# Patient Record
Sex: Male | Born: 1970 | Race: Black or African American | Hispanic: No | Marital: Single | Smoking: Current every day smoker
Health system: Southern US, Community
[De-identification: ages and names within clinical notes are randomized; demographics above are authoritative.]

## PROBLEM LIST (undated history)

## (undated) HISTORY — PX: ORTHOPEDIC SURGERY: SHX850

---

## 1999-04-14 ENCOUNTER — Emergency Department (HOSPITAL_COMMUNITY): Admission: EM | Admit: 1999-04-14 | Discharge: 1999-04-14 | Payer: Self-pay | Admitting: Emergency Medicine

## 1999-04-14 ENCOUNTER — Encounter: Payer: Self-pay | Admitting: Emergency Medicine

## 1999-04-16 ENCOUNTER — Emergency Department (HOSPITAL_COMMUNITY): Admission: EM | Admit: 1999-04-16 | Discharge: 1999-04-16 | Payer: Self-pay | Admitting: *Deleted

## 2002-07-02 ENCOUNTER — Encounter: Payer: Self-pay | Admitting: Emergency Medicine

## 2002-07-02 ENCOUNTER — Emergency Department (HOSPITAL_COMMUNITY): Admission: EM | Admit: 2002-07-02 | Discharge: 2002-07-02 | Payer: Self-pay | Admitting: Emergency Medicine

## 2002-11-23 ENCOUNTER — Emergency Department (HOSPITAL_COMMUNITY): Admission: EM | Admit: 2002-11-23 | Discharge: 2002-11-23 | Payer: Self-pay

## 2003-11-14 ENCOUNTER — Emergency Department (HOSPITAL_COMMUNITY): Admission: EM | Admit: 2003-11-14 | Discharge: 2003-11-14 | Payer: Self-pay | Admitting: Emergency Medicine

## 2004-01-27 ENCOUNTER — Emergency Department (HOSPITAL_COMMUNITY): Admission: EM | Admit: 2004-01-27 | Discharge: 2004-01-27 | Payer: Self-pay | Admitting: Emergency Medicine

## 2005-03-28 ENCOUNTER — Emergency Department (HOSPITAL_COMMUNITY): Admission: EM | Admit: 2005-03-28 | Discharge: 2005-03-28 | Payer: Self-pay | Admitting: Emergency Medicine

## 2005-03-30 ENCOUNTER — Emergency Department (HOSPITAL_COMMUNITY): Admission: EM | Admit: 2005-03-30 | Discharge: 2005-03-30 | Payer: Self-pay | Admitting: Emergency Medicine

## 2006-02-16 ENCOUNTER — Emergency Department (HOSPITAL_COMMUNITY): Admission: EM | Admit: 2006-02-16 | Discharge: 2006-02-16 | Payer: Self-pay | Admitting: Emergency Medicine

## 2006-04-11 ENCOUNTER — Emergency Department (HOSPITAL_COMMUNITY): Admission: EM | Admit: 2006-04-11 | Discharge: 2006-04-12 | Payer: Self-pay | Admitting: Emergency Medicine

## 2006-07-04 ENCOUNTER — Emergency Department (HOSPITAL_COMMUNITY): Admission: EM | Admit: 2006-07-04 | Discharge: 2006-07-04 | Payer: Self-pay | Admitting: Emergency Medicine

## 2007-06-28 ENCOUNTER — Emergency Department (HOSPITAL_COMMUNITY): Admission: EM | Admit: 2007-06-28 | Discharge: 2007-06-29 | Payer: Self-pay | Admitting: Emergency Medicine

## 2008-03-29 ENCOUNTER — Emergency Department (HOSPITAL_COMMUNITY): Admission: EM | Admit: 2008-03-29 | Discharge: 2008-03-30 | Payer: Self-pay | Admitting: Emergency Medicine

## 2008-05-17 ENCOUNTER — Emergency Department (HOSPITAL_COMMUNITY): Admission: EM | Admit: 2008-05-17 | Discharge: 2008-05-17 | Payer: Self-pay | Admitting: Emergency Medicine

## 2008-06-13 ENCOUNTER — Observation Stay (HOSPITAL_COMMUNITY): Admission: EM | Admit: 2008-06-13 | Discharge: 2008-06-14 | Payer: Self-pay | Admitting: Emergency Medicine

## 2008-07-08 ENCOUNTER — Emergency Department (HOSPITAL_COMMUNITY): Admission: EM | Admit: 2008-07-08 | Discharge: 2008-07-08 | Payer: Self-pay | Admitting: Emergency Medicine

## 2008-08-07 ENCOUNTER — Emergency Department (HOSPITAL_COMMUNITY): Admission: EM | Admit: 2008-08-07 | Discharge: 2008-08-08 | Payer: Self-pay | Admitting: Emergency Medicine

## 2009-05-28 ENCOUNTER — Emergency Department (HOSPITAL_COMMUNITY): Admission: EM | Admit: 2009-05-28 | Discharge: 2009-05-28 | Payer: Self-pay | Admitting: Emergency Medicine

## 2009-06-16 ENCOUNTER — Emergency Department (HOSPITAL_COMMUNITY): Admission: EM | Admit: 2009-06-16 | Discharge: 2009-06-17 | Payer: Self-pay | Admitting: Emergency Medicine

## 2009-10-04 ENCOUNTER — Emergency Department (HOSPITAL_COMMUNITY): Admission: EM | Admit: 2009-10-04 | Discharge: 2009-10-04 | Payer: Self-pay | Admitting: Emergency Medicine

## 2010-02-27 ENCOUNTER — Emergency Department (HOSPITAL_COMMUNITY): Admission: EM | Admit: 2010-02-27 | Discharge: 2010-02-27 | Payer: Self-pay | Admitting: Emergency Medicine

## 2010-08-04 LAB — CBC
MCHC: 34.5 g/dL (ref 30.0–36.0)
MCV: 100.5 fL — ABNORMAL HIGH (ref 78.0–100.0)
Platelets: 220 10*3/uL (ref 150–400)
RBC: 4.13 MIL/uL — ABNORMAL LOW (ref 4.22–5.81)
RDW: 13.4 % (ref 11.5–15.5)
WBC: 7.4 10*3/uL (ref 4.0–10.5)

## 2010-08-04 LAB — DIFFERENTIAL
Lymphocytes Relative: 38 % (ref 12–46)
Lymphs Abs: 2.8 10*3/uL (ref 0.7–4.0)
Neutro Abs: 3.9 10*3/uL (ref 1.7–7.7)

## 2010-08-04 LAB — RAPID URINE DRUG SCREEN, HOSP PERFORMED
Amphetamines: NOT DETECTED
Benzodiazepines: NOT DETECTED
Opiates: POSITIVE — AB
Tetrahydrocannabinol: NOT DETECTED

## 2010-08-04 LAB — POCT I-STAT, CHEM 8
BUN: 10 mg/dL (ref 6–23)
Potassium: 4 mEq/L (ref 3.5–5.1)
Sodium: 139 mEq/L (ref 135–145)
TCO2: 30 mmol/L (ref 0–100)

## 2010-09-02 LAB — DIFFERENTIAL
Eosinophils Relative: 0 % (ref 0–5)
Monocytes Absolute: 0.7 10*3/uL (ref 0.1–1.0)
Monocytes Relative: 6 % (ref 3–12)
Neutrophils Relative %: 77 % (ref 43–77)

## 2010-09-02 LAB — CBC
HCT: 38.9 % — ABNORMAL LOW (ref 39.0–52.0)
Hemoglobin: 13.1 g/dL (ref 13.0–17.0)
MCHC: 33.7 g/dL (ref 30.0–36.0)
MCV: 100.9 fL — ABNORMAL HIGH (ref 78.0–100.0)
RBC: 3.86 MIL/uL — ABNORMAL LOW (ref 4.22–5.81)
WBC: 12.3 10*3/uL — ABNORMAL HIGH (ref 4.0–10.5)

## 2010-09-02 LAB — POCT I-STAT, CHEM 8
BUN: 7 mg/dL (ref 6–23)
Calcium, Ion: 1.1 mmol/L — ABNORMAL LOW (ref 1.12–1.32)
Creatinine, Ser: 1.1 mg/dL (ref 0.4–1.5)
Glucose, Bld: 112 mg/dL — ABNORMAL HIGH (ref 70–99)
HCT: 42 % (ref 39.0–52.0)
Hemoglobin: 14.3 g/dL (ref 13.0–17.0)
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
## 2011-10-07 ENCOUNTER — Encounter (HOSPITAL_COMMUNITY): Payer: Self-pay | Admitting: Physical Medicine and Rehabilitation

## 2011-10-07 ENCOUNTER — Emergency Department (HOSPITAL_COMMUNITY)
Admission: EM | Admit: 2011-10-07 | Discharge: 2011-10-07 | Disposition: A | Discharge status: Home / Self Care | Payer: Self-pay | Attending: Emergency Medicine | Admitting: Emergency Medicine

## 2011-10-07 DIAGNOSIS — F17200 Nicotine dependence, unspecified, uncomplicated: Secondary | ICD-10-CM | POA: Insufficient documentation

## 2011-10-07 DIAGNOSIS — K047 Periapical abscess without sinus: Secondary | ICD-10-CM | POA: Insufficient documentation

## 2011-10-07 DIAGNOSIS — K089 Disorder of teeth and supporting structures, unspecified: Secondary | ICD-10-CM | POA: Insufficient documentation

## 2011-10-07 MED ORDER — PENICILLIN V POTASSIUM 500 MG PO TABS: 500.0000 mg | ORAL_TABLET | Freq: Four times a day (QID) | ORAL | Status: AC

## 2011-10-07 MED ORDER — HYDROCODONE-ACETAMINOPHEN 5-325 MG PO TABS
1.0000 | ORAL_TABLET | Freq: Once | ORAL | Status: AC
  Administered 2011-10-07: 1 via ORAL
  Filled 2011-10-07: qty 1

## 2011-10-07 MED ORDER — HYDROCODONE-ACETAMINOPHEN 5-325 MG PO TABS: 1.0000 | ORAL_TABLET | ORAL | Status: AC | PRN

## 2011-10-07 NOTE — ED Notes (Signed)
Pt presents to department for evaluation of L upper molar toothache. States pain became worse today. 10/10 upon arrival. States "I think I have an abscess tooth." also states headache. No facial swelling noted. Respirations unlabored. No signs of distress at the time.

## 2011-10-07 NOTE — Discharge Instructions (Signed)
Dental Abscess A dental abscess usually starts from an infected tooth. Antibiotic medicine and pain pills can be helpful, but dental infections require the attention of a dentist. Rinse around the infected area often with salt water (a pinch of salt in 8 oz of warm water). Do not apply heat to the outside of your face. See your dentist or oral surgeon as soon as possible.  SEEK IMMEDIATE MEDICAL CARE IF:  You have increasing, severe pain that is not relieved by medicine.   You or your child has an oral temperature above 102 F (38.9 C), not controlled by medicine.   Your baby is older than 3 months with a rectal temperature of 102 F (38.9 C) or higher.   Your baby is 3 months old or younger with a rectal temperature of 100.4 F (38 C) or higher.   You develop chills, severe headache, difficulty breathing, or trouble swallowing.   You have swelling in the neck or around the eye.  Document Released: 05/05/2005 Document Revised: 04/24/2011 Document Reviewed: 10/14/2006 [employer] Patient Information 2012 [employer]. 

## 2011-10-07 NOTE — ED Provider Notes (Signed)
History     CSN: [id number]  Arrival date & time 10/07/11  1703   First MD Initiated Contact with Patient 10/07/11 1836      Chief Complaint  Patient presents with  . Dental Pain    (Consider location/radiation/quality/duration/timing/severity/associated sxs/prior treatment) Patient is a 41 y.o. male presenting with tooth pain. The history is provided by the patient. No language interpreter was used.  Dental PainThe primary symptoms include mouth pain and oral lesions. The symptoms began yesterday. The symptoms are worsening. The symptoms are new.  Additional symptoms include: gum swelling and gum tenderness.  Pain, tenderness, and swelling noted above left upper incisor.  No past medical history on file.  No past surgical history on file.  History reviewed. No pertinent family history.  History  Substance Use Topics  . Smoking status: Current Everyday Smoker -- 0.5 packs/day    Types: Cigarettes  . Smokeless tobacco: Not on file  . Alcohol Use: No      Review of Systems  HENT: Positive for dental problem.   All other systems reviewed and are negative.    Allergies  Review of patient's allergies indicates no known allergies.  Home Medications   Current Outpatient Rx  Name Route Sig Dispense Refill  . ACETAMINOPHEN 500 MG PO TABS Oral Take 1,500 mg by mouth every 4 (four) hours as needed. For pain.    Marland Kitchen LORATADINE 10 MG PO TABS Oral Take 10 mg by mouth daily.      BP 126/82  Pulse 99  Temp(Src) 98.2 F (36.8 C) (Oral)  Resp 16  SpO2 97%  Physical Exam  Nursing note and vitals reviewed. Constitutional: He is oriented to person, place, and time. He appears well-developed and well-nourished.  HENT:  Head: Normocephalic.  Right Ear: External ear normal.  Left Ear: External ear normal.  Mouth/Throat: Dental abscesses present.    Eyes: Conjunctivae are normal. Pupils are equal, round, and reactive to light.  Neck: Normal range of motion. Neck supple.    Cardiovascular: Normal rate, regular rhythm, normal heart sounds and intact distal pulses.   Pulmonary/Chest: Effort normal and breath sounds normal.  Abdominal: Soft.  Musculoskeletal: Normal range of motion.  Lymphadenopathy:    He has no cervical adenopathy.  Neurological: He is alert and oriented to person, place, and time.  Skin: Skin is warm and dry.  Psychiatric: He has a normal mood and affect. His behavior is normal. Judgment and thought content normal.    ED Course  Procedures (including critical care time)  Labs Reviewed - No data to display No results found.   No diagnosis found.   Apical abscess MDM          Jimmye Norman, NP 10/07/11 [phone]

## 2011-10-11 NOTE — ED Provider Notes (Signed)
Medical screening examination/treatment/procedure(s) were performed by non-physician practitioner and as supervising physician I was immediately available for consultation/collaboration.   Shelda Jakes, MD 10/11/11 [phone]

## 2012-03-24 ENCOUNTER — Emergency Department (HOSPITAL_COMMUNITY): Payer: Self-pay

## 2012-03-24 ENCOUNTER — Encounter (HOSPITAL_COMMUNITY): Payer: Self-pay | Admitting: Family Medicine

## 2012-03-24 ENCOUNTER — Emergency Department (HOSPITAL_COMMUNITY)
Admission: EM | Admit: 2012-03-24 | Discharge: 2012-03-24 | Disposition: A | Discharge status: Home / Self Care | Payer: Self-pay | Attending: Emergency Medicine | Admitting: Emergency Medicine

## 2012-03-24 DIAGNOSIS — L0291 Cutaneous abscess, unspecified: Secondary | ICD-10-CM

## 2012-03-24 DIAGNOSIS — F17200 Nicotine dependence, unspecified, uncomplicated: Secondary | ICD-10-CM | POA: Insufficient documentation

## 2012-03-24 DIAGNOSIS — N498 Inflammatory disorders of other specified male genital organs: Secondary | ICD-10-CM | POA: Insufficient documentation

## 2012-03-24 LAB — CBC WITH DIFFERENTIAL/PLATELET
Basophils Absolute: 0 10*3/uL (ref 0.0–0.1)
Eosinophils Absolute: 0.1 10*3/uL (ref 0.0–0.7)
Eosinophils Relative: 1 % (ref 0–5)
Hemoglobin: 14.7 g/dL (ref 13.0–17.0)
Lymphs Abs: 2.3 10*3/uL (ref 0.7–4.0)
MCHC: 34.9 g/dL (ref 30.0–36.0)
MCV: 93.1 fL (ref 78.0–100.0)
Monocytes Relative: 7 % (ref 3–12)
Neutro Abs: 7 10*3/uL (ref 1.7–7.7)
Neutrophils Relative %: 70 % (ref 43–77)
RBC: 4.52 MIL/uL (ref 4.22–5.81)

## 2012-03-24 LAB — URINALYSIS, ROUTINE W REFLEX MICROSCOPIC
Glucose, UA: NEGATIVE mg/dL
Hgb urine dipstick: NEGATIVE
Leukocytes, UA: NEGATIVE
Nitrite: NEGATIVE
Protein, ur: NEGATIVE mg/dL
Specific Gravity, Urine: 1.008 (ref 1.005–1.030)
Urobilinogen, UA: 1 mg/dL (ref 0.0–1.0)
pH: 6 (ref 5.0–8.0)

## 2012-03-24 LAB — BASIC METABOLIC PANEL
Chloride: 102 mEq/L (ref 96–112)
Glucose, Bld: 102 mg/dL — ABNORMAL HIGH (ref 70–99)

## 2012-03-24 MED ORDER — MORPHINE SULFATE 4 MG/ML IJ SOLN
4.0000 mg | Freq: Once | INTRAMUSCULAR | Status: AC
  Administered 2012-03-24: 4 mg via INTRAVENOUS
  Filled 2012-03-24: qty 1

## 2012-03-24 MED ORDER — MORPHINE SULFATE 4 MG/ML IJ SOLN
2.0000 mg | Freq: Once | INTRAMUSCULAR | Status: AC
  Administered 2012-03-24: 2 mg via INTRAVENOUS
  Filled 2012-03-24: qty 1

## 2012-03-24 MED ORDER — SODIUM CHLORIDE 0.9 % IV SOLN
INTRAVENOUS | Status: DC
  Administered 2012-03-24: 16:00:00 via INTRAVENOUS

## 2012-03-24 MED ORDER — OXYCODONE-ACETAMINOPHEN 5-325 MG PO TABS: 1.0000 | ORAL_TABLET | ORAL | Status: DC | PRN

## 2012-03-24 NOTE — ED Provider Notes (Signed)
Francisco Lewis is a 41 y.o. male placed in the CDU awaiting results from scrotal ultrasound.  Patient presented with questionable abscess to the left scrotal region.  Concern for possible deep seeded abscess.  No perianal tenderness, induration or fluctuance.   On exam: hemodynamically stable, NAD, heart w/ RRR, lungs CTAB, Chest & abd non-tender, no peripheral edema or calf tenderness.  Patient complaining of increasing pain in the scrotal area; analgesia redosed.  Ultrasound with thick walled complex cystic lesion in the posterior left scrotum with hypervascular margins most compatible with scrotal abscess.  I will consult with urology for recommendation on I&D. here in the department.  INCISION AND DRAINAGE Performed by: Dierdre Forth Consent: Verbal consent obtained. Risks and benefits: risks, benefits and alternatives were discussed Type: abscess  Body area: scrotum  Anesthesia: local infiltration  Local anesthetic: lidocaine 2% without epinephrine  Anesthetic total: 3 ml  Complexity: complex Blunt dissection to break up loculations  Drainage: purulent  Drainage amount: 5cc  Packing material: 1/4 in iodoform gauze (discussed with Dr Laverle Patter, urology and Dr Linwood Dibbles)  Patient tolerance: Patient tolerated the procedure well with no immediate complications.   Patient with skin abscess amenable to incision and drainage.  Abscess was packing based on recommendations, wound recheck in 2 days with urology. Encouraged home warm compress.  Will d/c to home.  No antibiotic therapy is indicated.  1. Medications: percocet 2. Treatment: rest, drink plenty of fluids, take medication as prescribed 3. Follow Up: with Urology in the morning    [facility], PA-C 03/24/12 2211

## 2012-03-24 NOTE — ED Notes (Signed)
Dinner tray ordered.

## 2012-03-24 NOTE — ED Notes (Signed)
Pt. States that yesterday he noted a "knot" between his groin and his anus. Very painful at this time.

## 2012-03-24 NOTE — ED Provider Notes (Signed)
History  This chart was scribed for Francisco Kras, MD by Albertha Ghee Rifaie. This patient was seen in room TR04C/TR04C and the patient's care was started at 3:18 PM.   CSN: [id number]  Arrival date & time 03/24/12  1447   None     No chief complaint on file.   The history is provided by the patient. No language interpreter was used.    Francisco Lewis is a 41 y.o. male who presents to the Emergency Department complaining of gradually worsening, severely painful "knot" located between the groin and anus region onset yesterday. Pain is non-radiating. Pain is worse with palpation and movement. There is no drainage from site. There is associated polyuria.  He states that this is a new problem. He denies fever, chills, urinary problems, nausea, and vomiting. Patient is a current everyday smoker but he denies alcohol use. Patient reports no other significant past medical, surgical or family history.   History  Substance Use Topics  . Smoking status: Current Every Day Smoker -- 0.5 packs/day    Types: Cigarettes  . Smokeless tobacco: Not on file  . Alcohol Use: No    Review of Systems A complete 10 system review of systems was obtained and all systems are negative except as noted in the HPI and PMH.   Allergies  Review of patient's allergies indicates no known allergies.  Home Medications   Current Outpatient Rx  Name  Route  Sig  Dispense  Refill  . ACETAMINOPHEN 500 MG PO TABS   Oral   Take 1,500 mg by mouth every 4 (four) hours as needed. For pain.         Marland Kitchen LORATADINE 10 MG PO TABS   Oral   Take 10 mg by mouth daily.           BP 126/74  Pulse 91  Temp 98.2 F (36.8 C) (Oral)  Resp 18  SpO2 97%  Physical Exam  Nursing note and vitals reviewed. Constitutional: He appears well-developed and well-nourished. No distress.  HENT:  Head: Normocephalic and atraumatic.  Right Ear: External ear normal.  Left Ear: External ear normal.  Eyes: Conjunctivae normal are  normal. Right eye exhibits no discharge. Left eye exhibits no discharge. No scleral icterus.  Neck: Neck supple. No tracheal deviation present.  Cardiovascular: Normal rate, regular rhythm and intact distal pulses.   Pulmonary/Chest: Effort normal and breath sounds normal. No stridor. No respiratory distress. He has no wheezes. He has no rales.  Abdominal: Soft. Bowel sounds are normal. He exhibits no distension. There is no tenderness. There is no rebound and no guarding.  Genitourinary: Prostate normal. Right testis shows no mass.       No testicular mass or tenderness. Left hemiscrotal area of induration, fluctuance and tenderness. No perianal tenderness, induration or fluctuance.  Musculoskeletal: He exhibits no edema and no tenderness.  Neurological: He is alert. He has normal strength. No sensory deficit. Cranial nerve deficit:  no gross defecits noted. He exhibits normal muscle tone. He displays no seizure activity. Coordination normal.  Skin: Skin is warm and dry. No rash noted.  Psychiatric: He has a normal mood and affect.   ED Course  Procedures (including critical care time) DIAGNOSTIC STUDIES: Oxygen Saturation is 97% on room air, adequate by my interpretation.    COORDINATION OF CARE: 3:18 PM Discussed treatment plan with pt at bedside and pt agreed to plan.  No diagnosis found.    MDM  Pt has an area  of fluctuance and ttp at base of scrotum.  No surrounding erythema.  No crepitus.  Will [place] to evaluate for cellulitis, vs mass vs abscess.      I personally performed the services described in this documentation, which was scribed in my presence.  The recorded information has been reviewed and considered.   Francisco Kras, MD 03/24/12 [phone]

## 2012-03-25 NOTE — ED Provider Notes (Signed)
Medical screening examination/treatment/procedure(s) were conducted as a shared visit with non-physician practitioner(s) and myself.  I personally evaluated the patient during the encounter   Irem Stoneham R Aragon Scarantino, MD 03/25/12 1505 

## 2012-03-29 ENCOUNTER — Emergency Department (HOSPITAL_COMMUNITY)
Admission: EM | Admit: 2012-03-29 | Discharge: 2012-03-29 | Disposition: A | Discharge status: Home / Self Care | Payer: Self-pay | Attending: Emergency Medicine | Admitting: Emergency Medicine

## 2012-03-29 ENCOUNTER — Encounter (HOSPITAL_COMMUNITY): Payer: Self-pay | Admitting: Emergency Medicine

## 2012-03-29 DIAGNOSIS — F17200 Nicotine dependence, unspecified, uncomplicated: Secondary | ICD-10-CM | POA: Insufficient documentation

## 2012-03-29 DIAGNOSIS — Z79899 Other long term (current) drug therapy: Secondary | ICD-10-CM | POA: Insufficient documentation

## 2012-03-29 DIAGNOSIS — Z5189 Encounter for other specified aftercare: Secondary | ICD-10-CM

## 2012-03-29 DIAGNOSIS — Z4801 Encounter for change or removal of surgical wound dressing: Secondary | ICD-10-CM | POA: Insufficient documentation

## 2012-03-29 NOTE — ED Provider Notes (Signed)
History   This chart was scribed for Francisco Horn, MD by Gerlean Ren, ED Scribe. This patient was seen in room TR10C/TR10C and the patient's care was started at 5:18 PM    CSN: [id number]  Arrival date & time 03/29/12  1419   First MD Initiated Contact with Patient 03/29/12 1517      Chief Complaint  Patient presents with  . Wound Check    (Consider location/radiation/quality/duration/timing/severity/associated sxs/prior treatment) The history is provided by the patient. No language interpreter was used.   Francisco Lewis is a 41 y.o. male who presents to the Emergency Department to have a scrotal abscess re-checked that was drained 3 days ago.  Pt denies any fever, confusion, abdominal pain, chest pain, emesis, diarrhea, confusion.  Pt denies any increased pain, tenderness, swelling, or drainage from abscess.  Pt has no h/o chronic medical conditions.  Pt is a current everyday smoker but denies alcohol use.    History reviewed. No pertinent past medical history.  Past Surgical History  Procedure Date  . Orthopedic surgery     right leg hardware    History reviewed. No pertinent family history.  History  Substance Use Topics  . Smoking status: Current Every Day Smoker -- 0.5 packs/day    Types: Cigarettes  . Smokeless tobacco: Not on file  . Alcohol Use: No      Review of Systems 10 Systems reviewed and are negative for acute change except as noted in the HPI.  Allergies  Apple and Shellfish allergy  Home Medications   Current Outpatient Rx  Name  Route  Sig  Dispense  Refill  . LORATADINE 10 MG PO TABS   Oral   Take 10 mg by mouth daily.         . OXYCODONE-ACETAMINOPHEN 5-325 MG PO TABS   Oral   Take 1 tablet by mouth every 4 (four) hours as needed for pain.   20 tablet   0   . VISINE OP   Both Eyes   Place 1 drop into both eyes as needed. For irritated eyes.           BP 109/68  Pulse 70  Temp 98.5 F (36.9 C) (Oral)  Resp 20  SpO2  96%  Physical Exam  Nursing note and vitals reviewed. Constitutional:       Awake, alert, nontoxic appearance.  HENT:  Head: Atraumatic.  Eyes: Right eye exhibits no discharge. Left eye exhibits no discharge.  Neck: Neck supple.  Pulmonary/Chest: Effort normal. He exhibits no tenderness.  Abdominal: Soft. There is no tenderness. There is no rebound.  Genitourinary:       1cm incision draining scant purulence as expected.  Surrounding skin has no redness and is non-tender. Testicles non-tender with no palpable inguinal hernias, penis appears normal, no evidence of cellulitis on scrotal wall.  Musculoskeletal: He exhibits no tenderness.       Baseline ROM, no obvious new focal weakness.  Neurological:       Mental status and motor strength appears baseline for patient and situation.  Skin: No rash noted.  Psychiatric: He has a normal mood and affect.    ED Course  Procedures (including critical care time) DIAGNOSTIC STUDIES: Oxygen Saturation is 99% on room air, normal by my interpretation.    COORDINATION OF CARE: 5:24 PM- Patient informed of clinical course, understands medical decision-making process, and agrees with plan.         Labs Reviewed - No data to  display No results found.   1. Wound check, abscess       MDM   I personally performed the services described in this documentation, which was scribed in my presence. The recorded information has been reviewed and is accurate. I doubt any other EMC precluding discharge at this time including, but not necessarily limited to the following:sepsis, cellulitis.       Francisco Horn, MD 04/02/12 1800

## 2012-03-29 NOTE — ED Notes (Signed)
Pt here for wound check and packing removal from scrotal abscess

## 2012-04-23 ENCOUNTER — Emergency Department (HOSPITAL_COMMUNITY)
Admission: EM | Admit: 2012-04-23 | Discharge: 2012-04-23 | Disposition: A | Discharge status: Home / Self Care | Payer: Self-pay | Attending: Emergency Medicine | Admitting: Emergency Medicine

## 2012-04-23 ENCOUNTER — Encounter (HOSPITAL_COMMUNITY): Payer: Self-pay | Admitting: *Deleted

## 2012-04-23 DIAGNOSIS — F17200 Nicotine dependence, unspecified, uncomplicated: Secondary | ICD-10-CM | POA: Insufficient documentation

## 2012-04-23 DIAGNOSIS — K047 Periapical abscess without sinus: Secondary | ICD-10-CM | POA: Insufficient documentation

## 2012-04-23 MED ORDER — AMOXICILLIN 500 MG PO CAPS: 500.0000 mg | ORAL_CAPSULE | Freq: Three times a day (TID) | ORAL | Status: DC

## 2012-04-23 MED ORDER — HYDROCODONE-ACETAMINOPHEN 7.5-500 MG/15ML PO SOLN: 15.0000 mL | Freq: Four times a day (QID) | ORAL | Status: DC | PRN

## 2012-04-23 MED ORDER — OXYCODONE-ACETAMINOPHEN 5-325 MG PO TABS
2.0000 | ORAL_TABLET | Freq: Once | ORAL | Status: AC
  Administered 2012-04-23: 2 via ORAL
  Filled 2012-04-23: qty 2

## 2012-04-23 MED ORDER — IBUPROFEN 800 MG PO TABS: 800.0000 mg | ORAL_TABLET | Freq: Three times a day (TID) | ORAL | Status: DC

## 2012-04-23 NOTE — ED Provider Notes (Signed)
History     CSN: [id number]  Arrival date & time 04/23/12  0403   First MD Initiated Contact with Patient 04/23/12 [phone]      Chief Complaint  Patient presents with  . Dental Pain    (Consider location/radiation/quality/duration/timing/severity/associated sxs/prior treatment) HPI History per patient. Left lower first molar pain. Sharp in quality. No radiation. Severe pain. Hurts to chew. No known alleviating factors. Onset tonight. Has known cavities. No dentist. has history of same. History reviewed. No pertinent past medical history.  Past Surgical History  Procedure Date  . Orthopedic surgery     right leg hardware    No family history on file.  History  Substance Use Topics  . Smoking status: Current Every Day Smoker -- 0.5 packs/day    Types: Cigarettes  . Smokeless tobacco: Not on file  . Alcohol Use: No      Review of Systems  Constitutional: Negative for fever and chills.  HENT: Positive for dental problem. Negative for sore throat, drooling, trouble swallowing, neck pain, neck stiffness and voice change.   Eyes: Negative for pain.  Respiratory: Negative for shortness of breath.   Cardiovascular: Negative for chest pain.  Gastrointestinal: Negative for abdominal pain.  Genitourinary: Negative for dysuria.  Musculoskeletal: Negative for back pain.  Skin: Negative for rash.  Neurological: Negative for headaches.  All other systems reviewed and are negative.    Allergies  Apple and Shellfish allergy  Home Medications   Current Outpatient Rx  Name  Route  Sig  Dispense  Refill  . LORATADINE 10 MG PO TABS   Oral   Take 10 mg by mouth daily.         . OXYCODONE-ACETAMINOPHEN 5-325 MG PO TABS   Oral   Take 1 tablet by mouth every 4 (four) hours as needed for pain.   20 tablet   0   . VISINE OP   Both Eyes   Place 1 drop into both eyes as needed. For irritated eyes.           BP 149/103  Pulse 83  Temp 97.8 F (36.6 C) (Oral)  Resp 18   Ht 5\' 10"  (1.778 m)  Wt 200 lb (90.719 kg)  BMI 28.70 kg/m2  SpO2 99%  Physical Exam  Constitutional: He is oriented to person, place, and time. He appears well-developed and well-nourished.  HENT:  Head: Normocephalic and atraumatic.       Poor dentition with left lower first molar caries and tenderness to palpation. No gingival swelling. No trismus. Uvula midline. No facial swelling or erythema.  Eyes: EOM are normal. Pupils are equal, round, and reactive to light.  Neck: Neck supple.  Cardiovascular: Regular rhythm and intact distal pulses.   Pulmonary/Chest: Effort normal. No respiratory distress.  Musculoskeletal: Normal range of motion. He exhibits no edema.  Neurological: He is alert and oriented to person, place, and time.  Skin: Skin is warm and dry.    ED Course  Dental Date/Time: 04/23/2012 4:55 AM Performed by: Sunnie Nielsen Authorized by: Sunnie Nielsen Consent: Verbal consent obtained. Risks and benefits: risks, benefits and alternatives were discussed Consent given by: patient Patient understanding: patient states understanding of the procedure being performed Patient consent: the patient's understanding of the procedure matches consent given Procedure consent: procedure consent matches procedure scheduled Required items: required blood products, implants, devices, and special equipment available Time out: Immediately prior to procedure a "time out" was called to verify the correct patient, procedure, equipment, support  staff and site/side marked as required. Preparation: Patient was prepped and draped in the usual sterile fashion. Local anesthesia used: yes Local anesthetic: bupivacaine 0.5% without epinephrine Anesthetic total: 1.8 ml Patient tolerance: Patient tolerated the procedure well with no immediate complications. Comments: 27-gauge needle used to inject left lower first molar locally with adequate anesthesia achieved   (including critical care  time)   Pain improved post procedure feeling much better.  MDM   Dental pain and clinical abscess, improved with dental block as above. Medications provided. Precautions verbalizes understood. Plan antibiotics, Motrin narcotics and followup with dentist. Referrals provided. Vital signs and nursing notes reviewed.        Sunnie Nielsen, MD 04/23/12 [phone]

## 2012-04-23 NOTE — ED Notes (Signed)
Patient states he has a hole in his tooth (left bottom) and it also makes his eardrum hurt

## 2012-10-28 ENCOUNTER — Encounter (HOSPITAL_COMMUNITY): Payer: Self-pay

## 2012-10-28 ENCOUNTER — Emergency Department (HOSPITAL_COMMUNITY)
Admission: EM | Admit: 2012-10-28 | Discharge: 2012-10-28 | Disposition: A | Discharge status: Home / Self Care | Payer: Self-pay | Attending: Emergency Medicine | Admitting: Emergency Medicine

## 2012-10-28 DIAGNOSIS — F17200 Nicotine dependence, unspecified, uncomplicated: Secondary | ICD-10-CM | POA: Insufficient documentation

## 2012-10-28 DIAGNOSIS — K0889 Other specified disorders of teeth and supporting structures: Secondary | ICD-10-CM

## 2012-10-28 DIAGNOSIS — K089 Disorder of teeth and supporting structures, unspecified: Secondary | ICD-10-CM | POA: Insufficient documentation

## 2012-10-28 DIAGNOSIS — Z792 Long term (current) use of antibiotics: Secondary | ICD-10-CM | POA: Insufficient documentation

## 2012-10-28 MED ORDER — AMOXICILLIN 500 MG PO CAPS: 500.0000 mg | ORAL_CAPSULE | Freq: Three times a day (TID) | ORAL | Status: DC

## 2012-10-28 MED ORDER — OXYCODONE-ACETAMINOPHEN 5-325 MG PO TABS: ORAL_TABLET | ORAL | Status: DC

## 2012-10-28 MED ORDER — BUPIVACAINE-EPINEPHRINE PF 0.5-1:200000 % IJ SOLN: 1.8000 mL | Freq: Once | INTRAMUSCULAR | Status: DC

## 2012-10-28 NOTE — ED Notes (Signed)
Francisco Lewis at bedside injecting numbing medication

## 2012-10-28 NOTE — ED Provider Notes (Addendum)
History     CSN: [id number]  Arrival date & time 10/28/12  2242   First MD Initiated Contact with Patient 10/28/12 2255      Chief Complaint  Patient presents with  . Dental Pain    (Consider location/radiation/quality/duration/timing/severity/associated sxs/prior treatment) HPI  Francisco Lewis is a 42 y.o. male complaining of left lower tooth pain onset this evening. Pain is described as 9/10, exacerbated by chewing. Patient denies fever, difficulty swallowing, nausea vomiting.   History reviewed. No pertinent past medical history.  Past Surgical History  Procedure Laterality Date  . Orthopedic surgery      right leg hardware    History reviewed. No pertinent family history.  History  Substance Use Topics  . Smoking status: Current Every Day Smoker -- 0.50 packs/day    Types: Cigarettes  . Smokeless tobacco: Not on file  . Alcohol Use: No      Review of Systems  Constitutional:       Negative except as described in HPI  HENT:       Negative except as described in HPI  Respiratory:       Negative except as described in HPI  Cardiovascular:       Negative except as described in HPI  Gastrointestinal:       Negative except as described in HPI  Genitourinary:       Negative except as described in HPI  Musculoskeletal:       Negative except as described in HPI  Skin:       Negative except as described in HPI  Neurological:       Negative except as described in HPI  All other systems reviewed and are negative.    Allergies  Apple and Shellfish allergy  Home Medications   Current Outpatient Rx  Name  Route  Sig  Dispense  Refill  . amoxicillin (AMOXIL) 500 MG capsule   Oral   Take 1 capsule (500 mg total) by mouth 3 (three) times daily.   21 capsule   0   . HYDROcodone-acetaminophen (LORTAB) 7.5-500 MG/15ML solution   Oral   Take 15 mLs by mouth every 6 (six) hours as needed for pain.   60 mL   0   . ibuprofen (ADVIL,MOTRIN) 800 MG tablet  Oral   Take 1 tablet (800 mg total) by mouth 3 (three) times daily.   21 tablet   0   . loratadine (CLARITIN) 10 MG tablet   Oral   Take 10 mg by mouth daily.         Marland Kitchen oxyCODONE-acetaminophen (PERCOCET) 5-325 MG per tablet   Oral   Take 1 tablet by mouth every 4 (four) hours as needed for pain.   20 tablet   0   . Tetrahydrozoline HCl (VISINE OP)   Both Eyes   Place 1 drop into both eyes as needed. For irritated eyes.           BP 128/80  Pulse 78  Temp(Src) 99 F (37.2 C) (Oral)  Resp 18  SpO2 100%  Physical Exam  Nursing note and vitals reviewed. Constitutional: He is oriented to person, place, and time. He appears well-developed and well-nourished. No distress.  HENT:  Head: Normocephalic.  Mouth/Throat: Oropharynx is clear and moist.  Generally poor dentition, no gingival swelling, erythema or tenderness to palpation. Patient is handling their secretions. There is no tenderness to palpation or firmness underneath tongue bilaterally. No trismus.    Eyes: Conjunctivae and  EOM are normal. Pupils are equal, round, and reactive to light.  Cardiovascular: Normal rate, regular rhythm and intact distal pulses.   Pulmonary/Chest: Effort normal and breath sounds normal. No stridor.  Abdominal: Soft. Bowel sounds are normal.  Musculoskeletal: Normal range of motion.  Neurological: He is alert and oriented to person, place, and time.  Psychiatric: He has a normal mood and affect.    ED Course  NERVE BLOCK Date/Time: 10/28/2012 11:38 PM Performed by: Wynetta Emery Authorized by: Wynetta Emery Consent: Verbal consent obtained. Risks and benefits: risks, benefits and alternatives were discussed Consent given by: patient Indications: pain relief Body area: face/mouth Nerve: inferior alveolar Laterality: left Patient position: sitting Needle gauge: 27 G Local anesthetic: bupivacaine 0.5% with epinephrine Anesthetic total: 1.8 ml Outcome: pain improved    (including critical care time)  Labs Reviewed - No data to display No results found.   1. Pain, dental       MDM   Filed Vitals:   10/28/12 2249 10/28/12 2308  BP:  128/80  Pulse: 80 78  Temp: 99 F (37.2 C)   TempSrc: Oral   Resp: 20 18  SpO2: 98% 100%     Francisco Lewis is a 42 y.o. male Patient with toothache. Dental block given. No gross abscess.  Exam unconcerning for Ludwig's angina or spread of infection.  Will treat with penicillin and pain medicine.  Urged patient to follow-up with dentist.    The patient is hemodynamically stable, appropriate for, and amenable to, discharge at this time. Pt verbalized understanding and agrees with care plan. Outpatient follow-up and return precautions given.    New Prescriptions   AMOXICILLIN (AMOXIL) 500 MG CAPSULE    Take 1 capsule (500 mg total) by mouth 3 (three) times daily.   OXYCODONE-ACETAMINOPHEN (PERCOCET/ROXICET) 5-325 MG PER TABLET    1 to 2 tabs PO q6hrs  PRN for pain           Wynetta Emery, PA-C 10/28/12 2339  Wynetta Emery, PA-C 11/02/12 1539

## 2012-10-28 NOTE — ED Notes (Signed)
Pt complains of a toothache since this evening

## 2012-10-28 NOTE — ED Notes (Signed)
Pt has history of dental abscesses and dental caries,  Pt is alert and oriented,  He is rocking back and forth grimacing in pain,  Doesn't answer questions only shakes his head

## 2012-10-29 NOTE — ED Provider Notes (Signed)
Medical screening examination/treatment/procedure(s) were performed by non-physician practitioner and as supervising physician I was immediately available for consultation/collaboration.   Sunnie Nielsen, MD 10/29/12 2131

## 2012-11-03 NOTE — ED Provider Notes (Signed)
Medical screening examination/treatment/procedure(s) were performed by non-physician practitioner and as supervising physician I was immediately available for consultation/collaboration.  Sunnie Nielsen, MD 11/03/12 0000

## 2013-01-24 ENCOUNTER — Ambulatory Visit: Payer: Self-pay | Attending: Internal Medicine | Admitting: Internal Medicine

## 2013-01-24 ENCOUNTER — Encounter: Payer: Self-pay | Admitting: Internal Medicine

## 2013-01-24 VITALS — BP 142/89 | HR 77 | Temp 98.3°F | Resp 18 | Ht 71.0 in | Wt 212.0 lb

## 2013-01-24 DIAGNOSIS — M79609 Pain in unspecified limb: Secondary | ICD-10-CM | POA: Insufficient documentation

## 2013-01-24 DIAGNOSIS — R262 Difficulty in walking, not elsewhere classified: Secondary | ICD-10-CM | POA: Insufficient documentation

## 2013-01-24 DIAGNOSIS — M25561 Pain in right knee: Secondary | ICD-10-CM

## 2013-01-24 DIAGNOSIS — M25569 Pain in unspecified knee: Secondary | ICD-10-CM

## 2013-01-24 DIAGNOSIS — M7989 Other specified soft tissue disorders: Secondary | ICD-10-CM | POA: Insufficient documentation

## 2013-01-24 MED ORDER — MELOXICAM 15 MG PO TABS: 15.0000 mg | ORAL_TABLET | Freq: Every day | ORAL | Status: DC

## 2013-01-24 NOTE — Progress Notes (Signed)
PT HERE TO ESTABLISH CARE S/P INJURY WITH RODS,SCREW R LEG 1992 CHRONIC PAIN

## 2013-01-24 NOTE — Progress Notes (Signed)
Patient ID: Francisco Lewis, male   DOB: 01/12/1971, 42 y.o.   MRN: [id number] PCP:  Default, Provider, MD   Chief Complaint:  Pain over right leg  HPI: 42 year old male who has a rod and screws a swollen right leg and 89 following an injury presents with pain over the right knee and leg almost 4 months now. Patient reports having difficulty walking due to pain and difficulty bending his knees. He reports he doctor for disability he wants back and had an ??MRI which mentioned that he may have compressed the nerve by that the rod. He denies any fever, chills, headache, or vision, dizziness, chest pain, palpitations, shortness of breath, abdominal pain, bowel or urinary symptoms. Denies any trauma to the leg.  Allergies: Allergies  Allergen Reactions  . Apple     "gums and tongue itching"  . Shellfish Allergy     "gums and tongue itching"    Prior to Admission medications   Medication Sig Start Date End Date Taking? Authorizing Provider  amoxicillin (AMOXIL) 500 MG capsule Take 1 capsule (500 mg total) by mouth 3 (three) times daily. 04/23/12   Sunnie Nielsen, MD  amoxicillin (AMOXIL) 500 MG capsule Take 1 capsule (500 mg total) by mouth 3 (three) times daily. 10/28/12   Nicole Pisciotta, PA-C  HYDROcodone-acetaminophen (LORTAB) 7.5-500 MG/15ML solution Take 15 mLs by mouth every 6 (six) hours as needed for pain. 04/23/12   Sunnie Nielsen, MD  ibuprofen (ADVIL,MOTRIN) 800 MG tablet Take 1 tablet (800 mg total) by mouth 3 (three) times daily. 04/23/12   Sunnie Nielsen, MD  loratadine (CLARITIN) 10 MG tablet Take 10 mg by mouth daily.    Historical Provider, MD  meloxicam (MOBIC) 15 MG tablet Take 1 tablet (15 mg total) by mouth daily. 01/24/13   Denesia Donelan, MD  oxyCODONE-acetaminophen (PERCOCET) 5-325 MG per tablet Take 1 tablet by mouth every 4 (four) hours as needed for pain. 03/24/12   Hannah Muthersbaugh, PA-C  oxyCODONE-acetaminophen (PERCOCET/ROXICET) 5-325 MG per tablet 1 to 2 tabs PO q6hrs  PRN for  pain 10/28/12   Joni Reining Pisciotta, PA-C  Tetrahydrozoline HCl (VISINE OP) Place 1 drop into both eyes as needed. For irritated eyes.    Historical Provider, MD    History reviewed. No pertinent past medical history.  Past Surgical History  Procedure Laterality Date  . Orthopedic surgery      right leg hardware    Social History:  reports that he has been smoking Cigarettes.  He has been smoking about 0.50 packs per day. He does not have any smokeless tobacco history on file. He reports that he does not drink alcohol or use illicit drugs.  History reviewed. No pertinent family history.  Review of Systems:  As outlined in history of present illness   Physical Exam:  Filed Vitals:   01/24/13 1524  BP: 142/89  Pulse: 77  Temp: 98.3 F (36.8 C)  TempSrc: Oral  Resp: 18  Height: 5\' 11"  (1.803 m)  Weight: 212 lb (96.163 kg)  SpO2: 98%    Constitutional: Vital signs reviewed.  Patient is a well-developed and well-nourished in no acute distress and cooperative with exam. Alert and oriented x3.  HEENT: No pallor, moist oral mucosa Cardiovascular: RRR, S1 normal, S2 normal, no MRG,  Pulmonary/Chest: CTAB, no wheezes, rales, or rhonchi Abdominal: Soft. Non-tender, non-distended, bowel sounds are normal,  Musculoskeletal: scar with some deformity over right tibia. Pt reports pain on full flexion of the knee and rotation of the ankle  joint. No swelling  or effusion of the knee and ankle joints noted.  Ext: no edema and no cyanosis, pulses palpable bilaterally  Neurological: A&O x3,  Labs on Admission:  No results found for this or any previous visit (from the past 48 hour(s)).  Radiological Exams on Admission: No results found.  Assessment/Plan Right leg pain Total time in history of the right knee and right tibia and fibula evaluate the hardware and for any deformity. I will refer him to orthopedics for further evaluation  I will give him a short course of meloxicam and 15 mg  by mouth daily prn for pain  Patient counseled on smoking cessation Followup in 3 months  Viggo Perko 01/24/2013, 3:40 PM

## 2013-01-25 ENCOUNTER — Ambulatory Visit: Payer: Self-pay | Attending: Internal Medicine

## 2013-02-08 ENCOUNTER — Ambulatory Visit: Payer: Self-pay | Admitting: Internal Medicine

## 2013-02-08 ENCOUNTER — Ambulatory Visit: Payer: Self-pay

## 2013-02-21 ENCOUNTER — Encounter: Payer: Self-pay | Admitting: Internal Medicine

## 2013-02-21 ENCOUNTER — Ambulatory Visit: Payer: No Typology Code available for payment source | Attending: Internal Medicine | Admitting: Internal Medicine

## 2013-02-21 MED ORDER — OXYCODONE-ACETAMINOPHEN 5-325 MG PO TABS: 1.0000 | ORAL_TABLET | Freq: Three times a day (TID) | ORAL | Status: DC | PRN

## 2013-02-21 NOTE — Progress Notes (Unsigned)
Pt here with c/o right leg chronic pain Referred to pain management 03/17/13

## 2013-02-21 NOTE — Progress Notes (Unsigned)
Patient ID: Francisco Lewis, male   DOB: 1971/05/09, 42 y.o.   MRN: [id number]  Patient Demographics  Francisco Lewis, is a 42 y.o. male  WUJ:[id number]  [id number]  DOB - Feb 02, 1971  Chief Complaint  Patient presents with  . Follow-up  . Leg Pain        Subjective:   Norm Salt with History of chronic right leg pain secondary to an injury in the past requiring surgical intervention, he claims to have a rod in his right leg, he is here to get some narcotics filled until he is seen in the pain clinic.  Denies any subjective complaints except as above, no active headache, no chest abdominal pain at this time, not short of breath. No focal weakness which is new.     Objective:    There are no active problems to display for this patient.    Filed Vitals:   02/21/13 1718  BP: 135/90  Pulse: 65  Temp: 98.4 F (36.9 C)  TempSrc: Oral  Resp: 16  Height: 5\' 11"  (1.803 m)  Weight: 193 lb (87.544 kg)  SpO2: 100%     Exam   Awake Alert, Oriented X 3, No new F.N deficits, Normal affect Otho.AT,PERRAL Supple Neck,No JVD, No cervical lymphadenopathy appriciated.  Symmetrical Chest wall movement, Good air movement bilaterally, CTAB RRR,No Gallops,Rubs or new Murmurs, No Parasternal Heave +ve B.Sounds, Abd Soft, Non tender, No organomegaly appriciated, No rebound - guarding or rigidity. No Cyanosis, Clubbing or edema, No new Rash or bruise , chronic postsurgical changes in the right leg. No redness warmth or erythema.    Data Review   Lab Results  Component Value Date   WBC 10.0 03/24/2012   HGB 14.7 03/24/2012   HCT 42.1 03/24/2012   MCV 93.1 03/24/2012   PLT 228 03/24/2012      Chemistry      Component Value Date/Time   NA 138 03/24/2012 1452   K 4.6 03/24/2012 1452   CL 102 03/24/2012 1452   CO2 28 03/24/2012 1452   BUN 8 03/24/2012 1452   CREATININE 0.85 03/24/2012 1452      Component Value Date/Time   CALCIUM 9.5 03/24/2012 1452       No results found for  this basename: HGBA1C    No results found for this basename: CHOL, HDL, LDLCALC, LDLDIRECT, TRIG, CHOLHDL    No results found for this basename: TSH    No results found for this basename: PSA      Prior to Admission medications   Medication Sig Start Date End Date Taking? Authorizing Provider  meloxicam (MOBIC) 15 MG tablet Take 1 tablet (15 mg total) by mouth daily. 01/24/13  Yes Nishant Dhungel, MD  amoxicillin (AMOXIL) 500 MG capsule Take 1 capsule (500 mg total) by mouth 3 (three) times daily. 10/28/12   Nicole Pisciotta, PA-C  ibuprofen (ADVIL,MOTRIN) 800 MG tablet Take 1 tablet (800 mg total) by mouth 3 (three) times daily. 04/23/12   Sunnie Nielsen, MD  loratadine (CLARITIN) 10 MG tablet Take 10 mg by mouth daily.    Historical Provider, MD  oxyCODONE-acetaminophen (PERCOCET) 5-325 MG per tablet Take 1 tablet by mouth every 8 (eight) hours as needed for pain. 02/21/13   Leroy Sea, MD  Tetrahydrozoline HCl (VISINE OP) Place 1 drop into both eyes as needed. For irritated eyes.    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  Patient was requested to get an x-ray of his right leg last visit which he could not get and has been rescheduled for the ninth of this month, he had been referred to sports medicine pain clinic last visit however there is notes from the clinic in our referral department stating that they made multiple calls and never received an answer. He will be provided with pain clinic information by me today in writing. He's supposed to call them and schedule an appointment. For the time being he is requesting some narcotics he can be seen in the pain clinic which he takes on chronic basis I will give him one week supply.    I have told him clearly that if his x-ray shows any acute changes he will get further narcotics or  else there will be no further narcotics giving from this clinic.   He was observed clearly walking into the clinic without any discomfort, he was overly dramatic during my exam. Upon distraction he appeared to be in no distress.     History of smoking. Counseled to quit smoking.     Routine health maintenance.  He refused flu and tetanus shot   Leroy Sea M.D on 02/21/2013 at 5:27 PM

## 2013-03-02 ENCOUNTER — Ambulatory Visit (HOSPITAL_COMMUNITY)
Admission: RE | Admit: 2013-03-02 | Discharge: 2013-03-02 | Disposition: A | Discharge status: Home / Self Care | Payer: No Typology Code available for payment source | Source: Ambulatory Visit | Attending: Internal Medicine | Admitting: Internal Medicine

## 2013-03-02 DIAGNOSIS — M25561 Pain in right knee: Secondary | ICD-10-CM

## 2013-03-02 DIAGNOSIS — M79609 Pain in unspecified limb: Secondary | ICD-10-CM | POA: Insufficient documentation

## 2013-03-02 DIAGNOSIS — M25569 Pain in unspecified knee: Secondary | ICD-10-CM | POA: Insufficient documentation

## 2013-03-02 DIAGNOSIS — Z8781 Personal history of (healed) traumatic fracture: Secondary | ICD-10-CM | POA: Insufficient documentation

## 2013-03-02 DIAGNOSIS — M25579 Pain in unspecified ankle and joints of unspecified foot: Secondary | ICD-10-CM | POA: Insufficient documentation

## 2013-03-07 ENCOUNTER — Encounter: Payer: Self-pay | Admitting: Sports Medicine

## 2013-03-07 ENCOUNTER — Ambulatory Visit: Payer: No Typology Code available for payment source | Attending: Internal Medicine | Admitting: Internal Medicine

## 2013-03-07 ENCOUNTER — Encounter: Payer: Self-pay | Admitting: Internal Medicine

## 2013-03-07 ENCOUNTER — Ambulatory Visit (INDEPENDENT_AMBULATORY_CARE_PROVIDER_SITE_OTHER): Payer: No Typology Code available for payment source | Admitting: Sports Medicine

## 2013-03-07 VITALS — BP 124/82 | HR 83 | Ht 71.0 in | Wt 193.0 lb

## 2013-03-07 VITALS — BP 138/85 | HR 90 | Temp 98.2°F | Resp 16 | Wt 207.0 lb

## 2013-03-07 DIAGNOSIS — M79604 Pain in right leg: Secondary | ICD-10-CM

## 2013-03-07 DIAGNOSIS — M79609 Pain in unspecified limb: Secondary | ICD-10-CM | POA: Insufficient documentation

## 2013-03-07 DIAGNOSIS — M25569 Pain in unspecified knee: Secondary | ICD-10-CM

## 2013-03-07 DIAGNOSIS — M25561 Pain in right knee: Secondary | ICD-10-CM

## 2013-03-07 NOTE — Progress Notes (Signed)
Patient ID: Francisco Lewis, male   DOB: 07-18-1970, 42 y.o.   MRN: [id number]  HPI 42 year old male with history all of right leg daily and surgery done about 20 years back comes in for pain over his right leg. He has been seen twice in the last 6 weeks with the same complaint. During his last visit 2 weeks back he was referred to sports medicine and reports having a followup visit today. He reports that he was referred to a rehabilitation center in [place] with an appointment on 03/23/2013 and was told that the hardware on his right leg needs to be removed. Getting to previous visits he was given NSAIDs and a short course of Percocet but says that pain medicines don't work. He denies any fever, chills, headache, dizziness, chest pain, abdominal pain, nausea, vomiting, bowel or urinary symptoms.  Vital signs in last 24 hours:  Filed Vitals:   03/07/13 1445  BP: 138/85  Pulse: 90  Temp: 98.2 F (36.8 C)  Resp: 16  Weight: 207 lb (93.895 kg)  SpO2: 100%     Physical Exam:  General: Middle aged male in no acute distress. Musculoskeletal: scar with some deformity over right tibia.  reports pain on full flexion of the knee and rotation of the ankle joint. No swelling or effusion of the knee and ankle joints noted. no edema and no cyanosis, Neurological: A&O x3,     Lab Results:  Basic Metabolic Panel:    Component Value Date/Time   NA 138 03/24/2012 1452   K 4.6 03/24/2012 1452   CL 102 03/24/2012 1452   CO2 28 03/24/2012 1452   BUN 8 03/24/2012 1452   CREATININE 0.85 03/24/2012 1452   GLUCOSE 102* 03/24/2012 1452   CALCIUM 9.5 03/24/2012 1452   CBC:    Component Value Date/Time   WBC 10.0 03/24/2012 1452   HGB 14.7 03/24/2012 1452   HCT 42.1 03/24/2012 1452   PLT 228 03/24/2012 1452   MCV 93.1 03/24/2012 1452   NEUTROABS 7.0 03/24/2012 1452   LYMPHSABS 2.3 03/24/2012 1452   MONOABS 0.7 03/24/2012 1452   EOSABS 0.1 03/24/2012 1452   BASOSABS 0.0 03/24/2012 1452    No results found  for this or any previous visit (from the past 240 hour(s)).  Studies/Results: No results found.  Medications: Scheduled Meds: Continuous Infusions: PRN Meds:.    Assessment/Plan:  Right leg pain No referral to the pain clinic seen in the system so I have made another referral to ambulatory  pain clinic for further assessment and medication for his ongoing leg pain. I told him that I would give him a prescription for NSAIDs cannot give him any narcotics and will have to refer him to the pain clinic. Hearing this he was angry,  got up and started walking out of the office. He did not wish to have any other medications.   Referral to pain clinic made. Follow up as needed.   Ameliarose Shark 03/07/2013, 2:54 PM

## 2013-03-07 NOTE — Patient Instructions (Signed)
You have been scheduled for an appointment at [facility]  In [place], [place] on 03/23/13 at 11:00 am with Evangeline Gula, PA.   Address is: [address] [place], [place] [address] Phone number is : [phone]  Please call the finacial counselor at [phone] to discuss financial assistance.  Plan on bringing $150 for payment at first appointment

## 2013-03-07 NOTE — Progress Notes (Signed)
Patient here to get prescription for pain medication
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
## 2013-04-25 ENCOUNTER — Ambulatory Visit: Payer: Self-pay | Admitting: Internal Medicine

## 2013-06-29 ENCOUNTER — Ambulatory Visit: Payer: Self-pay

## 2013-07-14 ENCOUNTER — Ambulatory Visit: Payer: Self-pay

## 2013-10-22 ENCOUNTER — Encounter (HOSPITAL_COMMUNITY): Payer: Self-pay | Admitting: Emergency Medicine

## 2013-10-22 ENCOUNTER — Emergency Department (HOSPITAL_COMMUNITY)
Admission: EM | Admit: 2013-10-22 | Discharge: 2013-10-22 | Disposition: A | Discharge status: Home / Self Care | Payer: Self-pay | Attending: Emergency Medicine | Admitting: Emergency Medicine

## 2013-10-22 DIAGNOSIS — K089 Disorder of teeth and supporting structures, unspecified: Secondary | ICD-10-CM | POA: Insufficient documentation

## 2013-10-22 DIAGNOSIS — Z791 Long term (current) use of non-steroidal anti-inflammatories (NSAID): Secondary | ICD-10-CM | POA: Insufficient documentation

## 2013-10-22 DIAGNOSIS — Z79899 Other long term (current) drug therapy: Secondary | ICD-10-CM | POA: Insufficient documentation

## 2013-10-22 DIAGNOSIS — K0889 Other specified disorders of teeth and supporting structures: Secondary | ICD-10-CM

## 2013-10-22 DIAGNOSIS — F17200 Nicotine dependence, unspecified, uncomplicated: Secondary | ICD-10-CM | POA: Insufficient documentation

## 2013-10-22 MED ORDER — OXYCODONE-ACETAMINOPHEN 5-325 MG PO TABS: 1.0000 | ORAL_TABLET | ORAL | Status: DC | PRN

## 2013-10-22 MED ORDER — PENICILLIN V POTASSIUM 250 MG PO TABS
500.0000 mg | ORAL_TABLET | Freq: Once | ORAL | Status: AC
  Administered 2013-10-22: 500 mg via ORAL
  Filled 2013-10-22: qty 2

## 2013-10-22 MED ORDER — PENICILLIN V POTASSIUM 500 MG PO TABS: 500.0000 mg | ORAL_TABLET | Freq: Four times a day (QID) | ORAL | Status: DC

## 2013-10-22 MED ORDER — OXYCODONE-ACETAMINOPHEN 5-325 MG PO TABS
1.0000 | ORAL_TABLET | Freq: Once | ORAL | Status: AC
  Administered 2013-10-22: 1 via ORAL
  Filled 2013-10-22: qty 1

## 2013-10-22 MED ORDER — PROMETHAZINE HCL 25 MG PO TABS: 25.0000 mg | ORAL_TABLET | Freq: Four times a day (QID) | ORAL | Status: DC | PRN

## 2013-10-22 MED ORDER — BUPIVACAINE-EPINEPHRINE (PF) 0.25% -1:200000 IJ SOLN
1.8000 mL | Freq: Once | INTRAMUSCULAR | Status: AC
  Administered 2013-10-22: 1.8 mL
  Filled 2013-10-22: qty 10

## 2013-10-22 NOTE — ED Notes (Signed)
Pt presents to department for evaluation of L lower molar dental pain. 9/10 pain at present. Ongoing for several months. No facial swelling noted. Pt is alert and oriented x4.

## 2013-10-22 NOTE — ED Provider Notes (Signed)
Medical screening examination/treatment/procedure(s) were performed by non-physician practitioner and as supervising physician I was immediately available for consultation/collaboration.   EKG Interpretation None       Ethelda Chick, MD 10/22/13 2003

## 2013-10-22 NOTE — Discharge Instructions (Signed)
Dental Pain °A tooth ache may be caused by cavities (tooth decay). Cavities expose the nerve of the tooth to air and hot or cold temperatures. It may come from an infection or abscess (also called a boil or furuncle) around your tooth. It is also often caused by dental caries (tooth decay). This causes the pain you are having. °DIAGNOSIS  °Your caregiver can diagnose this problem by exam. °TREATMENT  °· If caused by an infection, it may be treated with medications which kill germs (antibiotics) and pain medications as prescribed by your caregiver. Take medications as directed. °· Only take over-the-counter or prescription medicines for pain, discomfort, or fever as directed by your caregiver. °· Whether the tooth ache today is caused by infection or dental disease, you should see your dentist as soon as possible for further care. °SEEK MEDICAL CARE IF: °The exam and treatment you received today has been provided on an emergency basis only. This is not a substitute for complete medical or dental care. If your problem worsens or new problems (symptoms) appear, and you are unable to meet with your dentist, call or return to this location. °SEEK IMMEDIATE MEDICAL CARE IF:  °· You have a fever. °· You develop redness and swelling of your face, jaw, or neck. °· You are unable to open your mouth. °· You have severe pain uncontrolled by pain medicine. °MAKE SURE YOU:  °· Understand these instructions. °· Will watch your condition. °· Will get help right away if you are not doing well or get worse. °Document Released: 05/05/2005 Document Revised: 07/28/2011 Document Reviewed: 12/22/2007 °Exit[employer]® Patient Information ©2014 [employer]. ° °Return to the emergency room for fever, change in vision, redness to the face that rapidly spreads towards the eye, nausea or vomiting, difficulty swallowing or shortness of breath. ° °You may follow with the dentist listed above. Alternatively,  you may also contact [facility]  [facility] who run a low-cost dental clinic at their phone number is [phone]. You may also call [phone] ° °Dental Assistance °If the dentist on-call cannot see you, please use the resources below: ° ° °Patients with Medicaid: [facility] °[address], [phone] °[address], [phone] ° °If unable to pay, or uninsured, contact [facility] ([phone]) or [facility] ([phone] in [place], [phone] in [place]) to become qualified for the adult dental clinic ° °Other Low-Cost Community Dental Services: °[facility]- [address], [place], [place], [address] °   [phone], Ext. 123 °   2nd and 4th Thursday of the month at 6:30am °   10 clients each day by appointment, can sometimes see walk-in     patients if someone does not show for an appointment °[facility]- [address], [place], [place], [address] °   [phone] °[facility]- [address], [place], [place], [address] °   [phone] ° °[facility]- [phone] °[facility]- [phone] °[facility]- [phone] ° °

## 2013-10-22 NOTE — ED Provider Notes (Signed)
CSN: [id number]     Arrival date & time 10/22/13  1740 History   First MD Initiated Contact with Patient 10/22/13 1903     Chief Complaint  Patient presents with  . Dental Pain     (Consider location/radiation/quality/duration/timing/severity/associated sxs/prior Treatment) HPI Comments: Patient is a 43 year old male who is otherwise healthy who presents today with the dental pain. He states that the dental pain has been ongoing for several months, but worsened today while he was eating a cinnamon bun. Since that time he has been having severe pain in his left face which radiates into his ear. He put in a temporary filling which he got at the drug store. This has not helped his pain. He put a piece of paper in his left ear because he was told to not put any air get to his tooth. She denies any trismus, fever, chills, nausea, vomiting.  The history is provided by the patient. No language interpreter was used.    History reviewed. No pertinent past medical history. Past Surgical History  Procedure Laterality Date  . Orthopedic surgery      right leg hardware   History reviewed. No pertinent family history. History  Substance Use Topics  . Smoking status: Current Every Day Smoker -- 0.50 packs/day    Types: Cigarettes  . Smokeless tobacco: Not on file  . Alcohol Use: No    Review of Systems  Constitutional: Negative for fever and chills.  HENT: Positive for dental problem. Negative for sore throat and trouble swallowing.   Respiratory: Negative for shortness of breath.   Cardiovascular: Negative for chest pain.  Gastrointestinal: Negative for nausea and vomiting.  All other systems reviewed and are negative.     Allergies  Apple and Shellfish allergy  Home Medications   Prior to Admission medications   Medication Sig Start Date End Date Taking? Authorizing Provider  ibuprofen (ADVIL,MOTRIN) 800 MG tablet Take 1 tablet (800 mg total) by mouth 3 (three) times daily. 04/23/12    Sunnie Nielsen, MD  loratadine (CLARITIN) 10 MG tablet Take 10 mg by mouth daily.    Historical Provider, MD  meloxicam (MOBIC) 15 MG tablet Take 1 tablet (15 mg total) by mouth daily. 01/24/13   Nishant Dhungel, MD  oxyCODONE-acetaminophen (PERCOCET) 5-325 MG per tablet Take 1 tablet by mouth every 8 (eight) hours as needed for pain. 02/21/13   Leroy Sea, MD  Tetrahydrozoline HCl (VISINE OP) Place 1 drop into both eyes as needed. For irritated eyes.    Historical Provider, MD   BP 158/107  Pulse 66  Temp(Src) 98 F (36.7 C) (Oral)  Resp 22  Ht 5\' 10"  (1.778 m)  Wt 200 lb (90.719 kg)  BMI 28.70 kg/m2  SpO2 100% Physical Exam  Nursing note and vitals reviewed. Constitutional: He is oriented to person, place, and time. He appears well-developed and well-nourished. He appears distressed.  Patient is writing around in pain.   HENT:  Head: Normocephalic and atraumatic.  Right Ear: External ear normal.  Left Ear: External ear normal.  Nose: Nose normal.  Mouth/Throat: Uvula is midline. No trismus in the jaw.  Generally poor dentition. Broken lower molars.  No trismus, submental edema, or tongue elevation.  No drainable abscess.   Eyes: Conjunctivae are normal.  Neck: Normal range of motion. No tracheal deviation present.  Cardiovascular: Normal rate, regular rhythm and normal heart sounds.   Pulmonary/Chest: Effort normal and breath sounds normal. No stridor.  Abdominal: Soft. He exhibits no  distension. There is no tenderness.  Musculoskeletal: Normal range of motion.  Neurological: He is alert and oriented to person, place, and time.  Skin: Skin is warm and dry. He is not diaphoretic.  Psychiatric: He has a normal mood and affect. His behavior is normal.    ED Course  Dental Date/Time: 10/22/2013 7:41 PM Performed by: Mora BellmanMERRELL, Kunio Cummiskey S Authorized by: Mora BellmanMERRELL, Vivika Poythress S Consent: Verbal consent obtained. written consent not obtained. The procedure was performed in an emergent  situation. Risks and benefits: risks, benefits and alternatives were discussed Consent given by: patient Patient understanding: patient states understanding of the procedure being performed Patient consent: the patient's understanding of the procedure matches consent given Required items: required blood products, implants, devices, and special equipment available Patient identity confirmed: verbally with patient and arm band Time out: Immediately prior to procedure a "time out" was called to verify the correct patient, procedure, equipment, support staff and site/side marked as required. Local anesthesia used: yes Anesthesia: see MAR for details Anesthetic total: 2 ml Patient sedated: no   (including critical care time) Labs Review Labs Reviewed - No data to display  Imaging Review No results found.   EKG Interpretation None      MDM   Final diagnoses:  Pain, dental    Patient with toothache.  No gross abscess.  Exam unconcerning for Ludwig's angina or spread of infection.  Will treat with penicillin and pain medicine.  Urged patient to follow-up with dentist.     Mora BellmanHannah S Connee Ikner, PA-C 10/22/13 2002
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
## 2016-06-15 ENCOUNTER — Encounter (HOSPITAL_COMMUNITY): Payer: Self-pay | Admitting: *Deleted

## 2016-06-15 ENCOUNTER — Emergency Department (HOSPITAL_COMMUNITY)
Admission: EM | Admit: 2016-06-15 | Discharge: 2016-06-16 | Disposition: A | Discharge status: Home / Self Care | Payer: Self-pay | Attending: Emergency Medicine | Admitting: Emergency Medicine

## 2016-06-15 DIAGNOSIS — J069 Acute upper respiratory infection, unspecified: Secondary | ICD-10-CM | POA: Insufficient documentation

## 2016-06-15 DIAGNOSIS — Z79899 Other long term (current) drug therapy: Secondary | ICD-10-CM | POA: Insufficient documentation

## 2016-06-15 DIAGNOSIS — F17210 Nicotine dependence, cigarettes, uncomplicated: Secondary | ICD-10-CM | POA: Insufficient documentation

## 2016-06-15 NOTE — ED Triage Notes (Signed)
The pt has had general body aches for 3-4 days with a prod cough temp  He last took =tylenol at 1500

## 2016-06-16 MED ORDER — ONDANSETRON 4 MG PO TBDP: 4.0000 mg | ORAL_TABLET | Freq: Three times a day (TID) | ORAL | 0 refills | Status: DC | PRN

## 2016-06-16 NOTE — ED Provider Notes (Signed)
[facility]-EMERGENCY DEPT Provider Note   CSN: [id number] Arrival date & time: 06/15/16  2202     History   Chief Complaint Chief Complaint  Patient presents with  . Generalized Body Aches    HPI Francisco Lewis is a 46 y.o. male.  Patient presents with symptoms x 2-3 days of generalized body aches, cough, fever, congestion and loss of appetite. He has nausea with vomiting and soft stool without large quantity diarrhea. He is otherwise healthy, smoker. He reports being around sick grandchildren this past week.    The history is provided by the patient. No language interpreter was used.    History reviewed. No pertinent past medical history.  There are no active problems to display for this patient.   Past Surgical History:  Procedure Laterality Date  . ORTHOPEDIC SURGERY     right leg hardware       Home Medications    Prior to Admission medications   Medication Sig Start Date End Date Taking? Authorizing Provider  ibuprofen (ADVIL,MOTRIN) 800 MG tablet Take 1 tablet (800 mg total) by mouth 3 (three) times daily. 04/23/12   Sunnie Nielsen, MD  loratadine (CLARITIN) 10 MG tablet Take 10 mg by mouth daily.    Historical Provider, MD  meloxicam (MOBIC) 15 MG tablet Take 1 tablet (15 mg total) by mouth daily. 01/24/13   Nishant Dhungel, MD  oxyCODONE-acetaminophen (PERCOCET) 5-325 MG per tablet Take 1 tablet by mouth every 8 (eight) hours as needed for pain. 02/21/13   Leroy Sea, MD  oxyCODONE-acetaminophen (PERCOCET/ROXICET) 5-325 MG per tablet Take 1 tablet by mouth every 4 (four) hours as needed for severe pain. May take 2 tablets PO q 6 hours for severe pain - Do not take with Tylenol as this tablet already contains tylenol 10/22/13   Junious Silk, PA-C  penicillin v potassium (VEETID) 500 MG tablet Take 1 tablet (500 mg total) by mouth 4 (four) times daily. 10/22/13   Junious Silk, PA-C  promethazine (PHENERGAN) 25 MG tablet Take 1 tablet (25 mg total) by mouth every 6 (six)  hours as needed for nausea or vomiting. 10/22/13   Junious Silk, PA-C  Tetrahydrozoline HCl (VISINE OP) Place 1 drop into both eyes as needed. For irritated eyes.    Historical Provider, MD    Family History No family history on file.  Social History Social History  Substance Use Topics  . Smoking status: Current Every Day Smoker    Packs/day: 0.50    Types: Cigarettes  . Smokeless tobacco: Never Used  . Alcohol use No     Allergies   Apple and Shellfish allergy   Review of Systems Review of Systems  Constitutional: Positive for appetite change, chills and fever.  HENT: Positive for congestion.   Respiratory: Positive for cough.   Cardiovascular: Negative.   Gastrointestinal: Positive for nausea and vomiting.  Genitourinary: Negative.  Negative for dysuria.  Musculoskeletal: Positive for myalgias.  Skin: Negative.  Negative for rash.  Neurological: Positive for weakness.     Physical Exam Updated Vital Signs BP 114/84 (BP Location: Right Arm)   Pulse 67   Temp 98.4 F (36.9 C) (Oral)   Resp 18   Ht 5\' 10"  (1.778 m)   Wt 79.4 kg   SpO2 98%   BMI 25.11 kg/m   Physical Exam  Constitutional: He is oriented to person, place, and time. He appears well-developed and well-nourished. No distress.  HENT:  Head: Normocephalic.  Eyes: Conjunctivae are normal.  Neck:  Normal range of motion. Neck supple.  Cardiovascular: Normal rate and regular rhythm.   No murmur heard. Pulmonary/Chest: Effort normal and breath sounds normal. He has no wheezes. He has no rales.  Abdominal: Soft. Bowel sounds are normal. There is no tenderness. There is no rebound and no guarding.  Musculoskeletal: Normal range of motion.  Neurological: He is alert and oriented to person, place, and time.  Skin: Skin is warm and dry. No rash noted.  Psychiatric: He has a normal mood and affect.     ED Treatments / Results  Labs (all labs ordered are listed, but only abnormal results are  displayed) Labs Reviewed - No data to display  EKG  EKG Interpretation None       Radiology No results found.  Procedures Procedures (including critical care time)  Medications Ordered in ED Medications - No data to display   Initial Impression / Assessment and Plan / ED Course  I have reviewed the triage vital signs and the nursing notes.  Pertinent labs & imaging results that were available during my care of the patient were reviewed by me and considered in my medical decision making (see chart for details).     Patient presents with symptoms or cough, body aches, fever, anorexia but tolerating fluids. Symptoms x 3 days. He appears in NAD, non-toxic. Symptoms and exam support viral process requiring supportive care. Discussed possibility of flu and return precautions.   Final Clinical Impressions(s) / ED Diagnoses   Final diagnoses:  None   1. Viral URI  New Prescriptions New Prescriptions   No medications on file     Elpidio AnisShari Sylvi Rybolt, PA-C 06/16/16 0022    Shon Batonourtney F Horton, MD 06/16/16 [phone]

## 2016-10-14 ENCOUNTER — Emergency Department (HOSPITAL_COMMUNITY)
Admission: EM | Admit: 2016-10-14 | Discharge: 2016-10-14 | Disposition: A | Discharge status: Home / Self Care | Payer: Self-pay | Attending: Emergency Medicine | Admitting: Emergency Medicine

## 2016-10-14 ENCOUNTER — Encounter (HOSPITAL_COMMUNITY): Payer: Self-pay | Admitting: Emergency Medicine

## 2016-10-14 DIAGNOSIS — Z79899 Other long term (current) drug therapy: Secondary | ICD-10-CM | POA: Insufficient documentation

## 2016-10-14 DIAGNOSIS — K047 Periapical abscess without sinus: Secondary | ICD-10-CM | POA: Insufficient documentation

## 2016-10-14 DIAGNOSIS — F17210 Nicotine dependence, cigarettes, uncomplicated: Secondary | ICD-10-CM | POA: Insufficient documentation

## 2016-10-14 MED ORDER — PENICILLIN V POTASSIUM 500 MG PO TABS: 500.0000 mg | ORAL_TABLET | Freq: Four times a day (QID) | ORAL | 0 refills | Status: DC

## 2016-10-14 MED ORDER — HYDROCODONE-ACETAMINOPHEN 5-325 MG PO TABS
1.0000 | ORAL_TABLET | Freq: Once | ORAL | Status: AC
  Administered 2016-10-14: 1 via ORAL
  Filled 2016-10-14: qty 1

## 2016-10-14 MED ORDER — PENICILLIN V POTASSIUM 250 MG PO TABS
500.0000 mg | ORAL_TABLET | Freq: Once | ORAL | Status: AC
  Administered 2016-10-14: 500 mg via ORAL
  Filled 2016-10-14: qty 2

## 2016-10-14 MED ORDER — TRAMADOL HCL 50 MG PO TABS: 50.0000 mg | ORAL_TABLET | Freq: Four times a day (QID) | ORAL | 0 refills | Status: DC | PRN

## 2016-10-14 MED ORDER — BUPIVACAINE-EPINEPHRINE (PF) 0.5% -1:200000 IJ SOLN
1.8000 mL | Freq: Once | INTRAMUSCULAR | Status: AC
  Administered 2016-10-14: 1.8 mL
  Filled 2016-10-14: qty 1.8

## 2016-10-14 NOTE — Discharge Instructions (Signed)
Take penicillin until finished. Continue with 600mg  ibuprofen every 6 hours for pain. You may supplement this with Tramadol as needed. Follow up with a dentist.

## 2016-10-14 NOTE — ED Triage Notes (Signed)
Reports pain in right lower jaw from a bad tooth.  Started this evening.  Took ibuprofen at home with no relief.

## 2016-10-14 NOTE — ED Provider Notes (Signed)
[facility] Provider Note   CSN: [id number] Arrival date & time: 10/14/16  0153    History   Chief Complaint Chief Complaint  Patient presents with  . Dental Pain    HPI Francisco Lewis is a 46 y.o. male.  46 year old male presents to the emergency department for evaluation of dental pain. He states that right lower dental pain began earlier yesterday evening and worsened during the night. He reports a constant, throbbing pain. No discharge in the mouth or history of oral trauma. Patient took ibuprofen at home with no relief. He has not seen a dentist in some time. He denies associated fevers. No inability to open his jaw. He believes that he may have a new cavity.      History reviewed. No pertinent past medical history.  There are no active problems to display for this patient.   Past Surgical History:  Procedure Laterality Date  . ORTHOPEDIC SURGERY     right leg hardware       Home Medications    Prior to Admission medications   Medication Sig Start Date End Date Taking? Authorizing Provider  ibuprofen (ADVIL,MOTRIN) 800 MG tablet Take 1 tablet (800 mg total) by mouth 3 (three) times daily. 04/23/12   Sunnie Nielsen, MD  loratadine (CLARITIN) 10 MG tablet Take 10 mg by mouth daily.    [provider]  meloxicam (MOBIC) 15 MG tablet Take 1 tablet (15 mg total) by mouth daily. 01/24/13   Dhungel, Theda Belfast, MD  ondansetron (ZOFRAN ODT) 4 MG disintegrating tablet Take 1 tablet (4 mg total) by mouth every 8 (eight) hours as needed for nausea or vomiting. 06/16/16   Elpidio Anis, PA-C  oxyCODONE-acetaminophen (PERCOCET) 5-325 MG per tablet Take 1 tablet by mouth every 8 (eight) hours as needed for pain. 02/21/13   Leroy Sea, MD  oxyCODONE-acetaminophen (PERCOCET/ROXICET) 5-325 MG per tablet Take 1 tablet by mouth every 4 (four) hours as needed for severe pain. May take 2 tablets PO q 6 hours for severe pain - Do not take with Tylenol as this tablet  already contains tylenol 10/22/13   Junious Silk, PA-C  penicillin v potassium (VEETID) 500 MG tablet Take 1 tablet (500 mg total) by mouth 4 (four) times daily. 10/14/16   Antony Madura, PA-C  promethazine (PHENERGAN) 25 MG tablet Take 1 tablet (25 mg total) by mouth every 6 (six) hours as needed for nausea or vomiting. 10/22/13   Junious Silk, PA-C  Tetrahydrozoline HCl (VISINE OP) Place 1 drop into both eyes as needed. For irritated eyes.    [provider]  traMADol (ULTRAM) 50 MG tablet Take 1 tablet (50 mg total) by mouth every 6 (six) hours as needed. 10/14/16   Antony Madura, PA-C    Family History No family history on file.  Social History Social History  Substance Use Topics  . Smoking status: Current Every Day Smoker    Packs/day: 0.50    Types: Cigarettes  . Smokeless tobacco: Never Used  . Alcohol use No     Allergies   Apple and Shellfish allergy   Review of Systems Review of Systems Ten systems reviewed and are negative for acute change, except as noted in the HPI.    Physical Exam Updated Vital Signs BP 125/82 (BP Location: Left Arm)   Pulse 67   Temp 98.4 F (36.9 C) (Oral)   Resp 16   Ht 5\' 11"  (1.803 m)   Wt 88.5 kg (195 lb)   SpO2  99%   BMI 27.20 kg/m   Physical Exam  Constitutional: He is oriented to person, place, and time. He appears well-developed and well-nourished. No distress.  Nontoxic and in NAD  HENT:  Head: Normocephalic and atraumatic.  Mouth/Throat: Oropharynx is clear and moist. No trismus in the jaw. Abnormal dentition. Dental caries present. No uvula swelling.    Dental cavity to the medial aspect of the R lower 2nd molar. Purulence noted at base of tooth. Also multiple areas of dental decay. No trismus. Uvula midline. Soft oral floor. Patient tolerating secretions without difficulty.  Eyes: Conjunctivae and EOM are normal. No scleral icterus.  Neck: Normal range of motion.  Pulmonary/Chest: Effort normal. No respiratory  distress. He has no wheezes.  Respirations even and unlabored  Musculoskeletal: Normal range of motion.  Lymphadenopathy:    He has cervical adenopathy (right anterior cervical).  Neurological: He is alert and oriented to person, place, and time. He exhibits normal muscle tone. Coordination normal.  Skin: Skin is warm and dry. No rash noted. He is not diaphoretic. No erythema. No pallor.  Psychiatric: He has a normal mood and affect. His behavior is normal.  Nursing note and vitals reviewed.    ED Treatments / Results  Labs (all labs ordered are listed, but only abnormal results are displayed) Labs Reviewed - No data to display  EKG  EKG Interpretation None       Radiology No results found.  Procedures Dental Block Date/Time: 10/14/2016 4:23 AM Performed by: Antony MaduraHUMES, Messina Kosinski Authorized by: Antony MaduraHUMES, Jarquis Walker   Consent:    Consent obtained:  Verbal   Consent given by:  Patient Indications:    Indications: dental pain   Location:    Block type:  Inferior alveolar   Laterality:  Right Procedure details (see MAR for exact dosages):    Syringe type:  Luer lock syringe   Needle gauge:  25 G   Anesthetic injected:  Bupivacaine 0.5% WITH epi   Injection procedure:  Anatomic landmarks identified, introduced needle, anatomic landmarks palpated and negative aspiration for blood Post-procedure details:    Outcome:  Pain improved   Patient tolerance of procedure:  Tolerated well, no immediate complications    (including critical care time)  Medications Ordered in ED Medications  bupivacaine-epinephrine (MARCAINE W/ EPI) 0.5% -1:200000 injection 1.8 mL (1.8 mLs Infiltration Given 10/14/16 0500)  penicillin v potassium (VEETID) tablet 500 mg (500 mg Oral Given 10/14/16 0544)  HYDROcodone-acetaminophen (NORCO/VICODIN) 5-325 MG per tablet 1 tablet (1 tablet Oral Given 10/14/16 0545)     Initial Impression / Assessment and Plan / ED Course  I have reviewed the triage vital signs and the  nursing notes.  Pertinent labs & imaging results that were available during my care of the patient were reviewed by me and considered in my medical decision making (see chart for details).     Patient with toothache. Exam c/w dental abscess. No red flags or signs concerning for Ludwig's angina or spread of infection. Will treat with penicillin and pain medicine.  Urged patient to follow-up with dentist. Return precautions discussed and provided. Patient discharged in stable condition with no unaddressed concerns.   Final Clinical Impressions(s) / ED Diagnoses   Final diagnoses:  Dental abscess    New Prescriptions New Prescriptions   TRAMADOL (ULTRAM) 50 MG TABLET    Take 1 tablet (50 mg total) by mouth every 6 (six) hours as needed.     Antony MaduraHumes, Grant Swager, PA-C 10/14/16 [id number]    Preston FleetingGlick,  Onalee Hua, MD 10/14/16 [phone]

## 2017-02-05 ENCOUNTER — Encounter (HOSPITAL_COMMUNITY): Payer: Self-pay | Admitting: Emergency Medicine

## 2017-02-05 ENCOUNTER — Ambulatory Visit (HOSPITAL_COMMUNITY)
Admission: EM | Admit: 2017-02-05 | Discharge: 2017-02-05 | Disposition: A | Discharge status: Home / Self Care | Payer: Self-pay | Attending: Nurse Practitioner | Admitting: Nurse Practitioner

## 2017-02-05 DIAGNOSIS — K047 Periapical abscess without sinus: Secondary | ICD-10-CM

## 2017-02-05 MED ORDER — PENICILLIN V POTASSIUM 500 MG PO TABS
500.0000 mg | ORAL_TABLET | Freq: Four times a day (QID) | ORAL | 0 refills | Status: AC
Start: 2017-02-05 — End: 2017-02-15

## 2017-02-05 MED ORDER — IBUPROFEN 800 MG PO TABS: 800.0000 mg | ORAL_TABLET | Freq: Three times a day (TID) | ORAL | 0 refills | Status: DC

## 2017-02-05 NOTE — ED Provider Notes (Signed)
[facility]    CSN: [id number] Arrival date & time: 02/05/17  1300     History   Chief Complaint Chief Complaint  Patient presents with  . Dental Pain    HPI Francisco Lewis is a 46 y.o. male.   Patient presents with complaints of left lower tooth abscess. Onset of symptoms was abrupt starting 2 days ago. Patient describes pain as throbbing. Pain severity at worst is severe. The pain does not radiate. Patient denies any fevers, sweats, chills, ear pain, difficulty swallowing, congestion or heat/cold intolerance. Pain is aggravated by chewing. Pain is alleviated by rest. Patient has not sought treatment by another care provider for this problem. Care prior to arrival consisted of nothing.         History reviewed. No pertinent past medical history.  There are no active problems to display for this patient.   Past Surgical History:  Procedure Laterality Date  . ORTHOPEDIC SURGERY     right leg hardware       Home Medications    Prior to Admission medications   Medication Sig Start Date End Date Taking? Authorizing Provider  ibuprofen (ADVIL,MOTRIN) 800 MG tablet Take 1 tablet (800 mg total) by mouth 3 (three) times daily. 04/23/12   Sunnie Nielsen, MD  loratadine (CLARITIN) 10 MG tablet Take 10 mg by mouth daily.    [provider]  meloxicam (MOBIC) 15 MG tablet Take 1 tablet (15 mg total) by mouth daily. 01/24/13   Dhungel, Theda Belfast, MD  ondansetron (ZOFRAN ODT) 4 MG disintegrating tablet Take 1 tablet (4 mg total) by mouth every 8 (eight) hours as needed for nausea or vomiting. 06/16/16   Elpidio Anis, PA-C  oxyCODONE-acetaminophen (PERCOCET) 5-325 MG per tablet Take 1 tablet by mouth every 8 (eight) hours as needed for pain. 02/21/13   Leroy Sea, MD  oxyCODONE-acetaminophen (PERCOCET/ROXICET) 5-325 MG per tablet Take 1 tablet by mouth every 4 (four) hours as needed for severe pain. May take 2 tablets PO q 6 hours for severe pain - Do not take  with Tylenol as this tablet already contains tylenol 10/22/13   Junious Silk, PA-C  penicillin v potassium (VEETID) 500 MG tablet Take 1 tablet (500 mg total) by mouth 4 (four) times daily. 10/14/16   Antony Madura, PA-C  promethazine (PHENERGAN) 25 MG tablet Take 1 tablet (25 mg total) by mouth every 6 (six) hours as needed for nausea or vomiting. 10/22/13   Junious Silk, PA-C  Tetrahydrozoline HCl (VISINE OP) Place 1 drop into both eyes as needed. For irritated eyes.    [provider]  traMADol (ULTRAM) 50 MG tablet Take 1 tablet (50 mg total) by mouth every 6 (six) hours as needed. 10/14/16   Antony Madura, PA-C    Family History No family history on file.  Social History Social History  Substance Use Topics  . Smoking status: Current Every Day Smoker    Packs/day: 0.50    Types: Cigarettes  . Smokeless tobacco: Never Used  . Alcohol use No     Allergies   Apple and Shellfish allergy   Review of Systems Review of Systems  Constitutional: Negative for chills, diaphoresis and fever.  HENT: Positive for dental problem and facial swelling. Negative for drooling, ear pain, sore throat and trouble swallowing.   All other systems reviewed and are negative.    Physical Exam Triage Vital Signs ED Triage Vitals  Enc Vitals Group     BP 02/05/17 1322 113/79  Pulse Rate 02/05/17 1322 61     Resp 02/05/17 1322 20     Temp 02/05/17 1322 98.4 F (36.9 C)     Temp Source 02/05/17 1322 Oral     SpO2 02/05/17 1322 97 %     Weight --      Height --      Head Circumference --      Peak Flow --      Pain Score 02/05/17 1320 8     Pain Loc --      Pain Edu? --      Excl. in GC? --    No data found.   Updated Vital Signs BP 113/79 (BP Location: Left Arm)   Pulse 61   Temp 98.4 F (36.9 C) (Oral)   Resp 20   SpO2 97%   Visual Acuity Right Eye Distance:   Left Eye Distance:   Bilateral Distance:    Right Eye Near:   Left Eye Near:    Bilateral Near:      Physical Exam  Constitutional: He is oriented to person, place, and time. He appears well-developed and well-nourished.  HENT:  Head: Normocephalic.  Mouth/Throat: Oropharynx is clear and moist. No oropharyngeal exudate.  Left lower dental abscess noted with no drainable pocket. No significant dental caries noted.   Eyes: Conjunctivae are normal.  Neck: Normal range of motion. Neck supple.  Cardiovascular: Normal rate, regular rhythm and normal heart sounds.   Pulmonary/Chest: Effort normal and breath sounds normal.  Musculoskeletal: Normal range of motion.  Lymphadenopathy:    He has no cervical adenopathy.  Neurological: He is alert and oriented to person, place, and time.  Skin: Skin is warm and dry.  Psychiatric: He has a normal mood and affect.     UC Treatments / Results  Labs (all labs ordered are listed, but only abnormal results are displayed) Labs Reviewed - No data to display  EKG  EKG Interpretation None       Radiology No results found.  Procedures Procedures (including critical care time)  Medications Ordered in UC Medications - No data to display   Initial Impression / Assessment and Plan / UC Course  I have reviewed the triage vital signs and the nursing notes.  Pertinent labs & imaging results that were available during my care of the patient were reviewed by me and considered in my medical decision making (see chart for details).    46 year old male presenting with a two-day history of left lower dental abscess. No drainable pocket noted in clinic. Will prescribe course of antibiotics and NSAIDs. Referral given to dentistry. Discussed diagnosis and treatment with patient. All questions have been answered and all concerns have been addressed. The patient verbalized understanding and had no further questions    Final Clinical Impressions(s) / UC Diagnoses   Final diagnoses:  Dental abscess    New Prescriptions New Prescriptions   No  medications on file     Controlled Substance Prescriptions [place] Controlled Substance Registry consulted? Not Applicable   Lurline Idol, [place] 02/05/17 [phone]

## 2017-02-05 NOTE — ED Triage Notes (Signed)
Dental pain started 02/03/17.  Pain is bottom, left.  Visible swelling is present

## 2017-04-14 ENCOUNTER — Emergency Department (HOSPITAL_COMMUNITY): Payer: Self-pay

## 2017-04-14 ENCOUNTER — Encounter (HOSPITAL_COMMUNITY): Payer: Self-pay | Admitting: Emergency Medicine

## 2017-04-14 DIAGNOSIS — R0789 Other chest pain: Secondary | ICD-10-CM | POA: Insufficient documentation

## 2017-04-14 DIAGNOSIS — F17210 Nicotine dependence, cigarettes, uncomplicated: Secondary | ICD-10-CM | POA: Insufficient documentation

## 2017-04-14 DIAGNOSIS — Z79899 Other long term (current) drug therapy: Secondary | ICD-10-CM | POA: Insufficient documentation

## 2017-04-14 LAB — CBC
HEMATOCRIT: 40.8 % (ref 39.0–52.0)
Hemoglobin: 14.1 g/dL (ref 13.0–17.0)
MCH: 32.4 pg (ref 26.0–34.0)
MCHC: 34.6 g/dL (ref 30.0–36.0)
MCV: 93.8 fL (ref 78.0–100.0)
PLATELETS: 212 10*3/uL (ref 150–400)
RBC: 4.35 MIL/uL (ref 4.22–5.81)
RDW: 13 % (ref 11.5–15.5)
WBC: 7.8 10*3/uL (ref 4.0–10.5)

## 2017-04-14 LAB — I-STAT TROPONIN, ED: Troponin i, poc: 0 ng/mL (ref 0.00–0.08)

## 2017-04-14 LAB — BASIC METABOLIC PANEL
Anion gap: 7 (ref 5–15)
BUN: 15 mg/dL (ref 6–20)
CALCIUM: 8.6 mg/dL — AB (ref 8.9–10.3)
CO2: 25 mmol/L (ref 22–32)
CREATININE: 1.1 mg/dL (ref 0.61–1.24)
Chloride: 106 mmol/L (ref 101–111)
GFR calc Af Amer: >60 mL/min (ref >60)
GLUCOSE: 135 mg/dL — AB (ref 65–99)
POTASSIUM: 4.3 mmol/L (ref 3.5–5.1)
SODIUM: 138 mmol/L (ref 135–145)

## 2017-04-14 NOTE — ED Triage Notes (Signed)
Patient arrives with complaint of left chest pain for 10 minutes. States history of the same but it never lasted. Lifting left arm exacerbates pain, taking deep breath exacerbates pain, palpation does not reproduce. Denies other symptoms.

## 2017-04-15 ENCOUNTER — Emergency Department (HOSPITAL_COMMUNITY)
Admission: EM | Admit: 2017-04-15 | Discharge: 2017-04-15 | Disposition: A | Discharge status: Home / Self Care | Payer: Self-pay | Attending: Emergency Medicine | Admitting: Emergency Medicine

## 2017-04-15 DIAGNOSIS — R0789 Other chest pain: Secondary | ICD-10-CM

## 2017-04-15 LAB — I-STAT TROPONIN, ED: Troponin i, poc: 0 ng/mL (ref 0.00–0.08)

## 2017-04-15 MED ORDER — KETOROLAC TROMETHAMINE 30 MG/ML IJ SOLN
60.0000 mg | Freq: Once | INTRAMUSCULAR | Status: AC
Start: 2017-04-15 — End: 2017-04-15
  Administered 2017-04-15: 60 mg via INTRAMUSCULAR
  Filled 2017-04-15: qty 2

## 2017-04-15 NOTE — ED Provider Notes (Signed)
TIME SEEN: 2:27 AM  CHIEF COMPLAINT: Chest pain  HPI: Patient is a 46 year old male with history of tobacco use who presents to the emergency department with complaints of left-sided chest pain mostly underneath his left arm.  Started after eating dinner last night.  Pain worse with deep inspiration and movement of the left arm.  Described as a stretching, sharp pain.  Only present with movement and deep inspiration.  No associated shortness of breath, nausea, vomiting, diaphoresis or dizziness.  No fever or cough.  No history of PE, DVT, exogenous estrogen use, recent fractures, surgery, trauma, hospitalization or prolonged travel. No lower extremity swelling or pain. No calf tenderness.  He denies a history of hypertension, diabetes or hyperlipidemia.  No previous history of stress test or cardiac catheterization.  States he is right-hand dominant but has been using his left arm more today while at work.  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   ROS: See HPI Constitutional: no fever  Eyes: no drainage  ENT: no runny nose   Cardiovascular:  no chest pain  Resp: no SOB  GI: no vomiting GU: no dysuria Integumentary: no rash  Allergy: no hives  Musculoskeletal: no leg swelling  Neurological: no slurred speech ROS otherwise negative  PAST MEDICAL HISTORY/PAST SURGICAL HISTORY:  History reviewed. No pertinent past medical history.  MEDICATIONS:  Prior to Admission medications   Medication Sig Start Date End Date Taking? Authorizing Provider  ibuprofen (ADVIL,MOTRIN) 800 MG tablet Take 1 tablet (800 mg total) by mouth 3 (three) times daily. 04/23/12   Sunnie Nielsenpitz, Brian, MD  ibuprofen (ADVIL,MOTRIN) 800 MG tablet Take 1 tablet (800 mg total) by mouth 3 (three) times daily. 02/05/17   Lurline IdolMurrill, Samantha, FNP    ALLERGIES:  Allergies  Allergen Reactions  . Apple     "gums and tongue itching"  . Shellfish Allergy     "gums and tongue itching"    SOCIAL HISTORY:  Social History   Tobacco Use  .  Smoking status: Current Every Day Smoker    Packs/day: 0.50    Types: Cigarettes  . Smokeless tobacco: Never Used  Substance Use Topics  . Alcohol use: No    FAMILY HISTORY: History reviewed. No pertinent family history.  EXAM: BP 100/63 (BP Location: Right Arm)   Pulse (!) 56   Temp 98.4 F (36.9 C) (Oral)   Resp 16   Ht 5\' 11"  (1.803 m)   Wt 88.9 kg (196 lb)   SpO2 100%   BMI 27.34 kg/m  CONSTITUTIONAL: Alert and oriented and responds appropriately to questions. Well-appearing; well-nourished HEAD: Normocephalic EYES: Conjunctivae clear, pupils appear equal, EOMI ENT: normal nose; moist mucous membranes NECK: Supple, no meningismus, no nuchal rigidity, no LAD  CARD: RRR; S1 and S2 appreciated; no murmurs, no clicks, no rubs, no gallops CHEST:  Chest wall is tender to palpation over the left lateral chest wall which reproduces his pain.  No crepitus, ecchymosis, erythema, warmth, rash or other lesions present.   RESP: Normal chest excursion without splinting or tachypnea; breath sounds clear and equal bilaterally; no wheezes, no rhonchi, no rales, no hypoxia or respiratory distress, speaking full sentences ABD/GI: Normal bowel sounds; non-distended; soft, non-tender, no rebound, no guarding, no peritoneal signs, no hepatosplenomegaly BACK:  The back appears normal and is non-tender to palpation, there is no CVA tenderness EXT: Normal ROM in all joints; non-tender to palpation; no edema; normal capillary refill; no cyanosis, no calf tenderness or swelling; extremities are warm and well perfused SKIN:  Normal color for age and race; warm; no rash NEURO: Moves all extremities equally PSYCH: The patient's mood and manner are appropriate. Grooming and personal hygiene are appropriate.  MEDICAL DECISION MAKING: Patient here with atypical chest pain.  Seems to be more musculoskeletal in nature.  He is PERC negative.  First troponin negative.  EKG shows no ischemic abnormality.  Chest  x-ray clear.  No pneumonia.  No sign of volume overload.  Doubt ACS, PE, dissection.  Will obtain second troponin and give IM Toradol for symptom relief.  Anticipate if second troponin is negative the patient will be discharged with outpatient follow-up.  ED PROGRESS: Patient second troponin is negative.  Pain improved after Toradol.  I feel he is safe for discharge.  Will give outpatient PCP follow-up.  Recommended alternating Tylenol and Motrin as needed for pain.  At this time, I do not feel there is any life-threatening condition present. I have reviewed and discussed all results (EKG, imaging, lab, urine as appropriate) and exam findings with patient/family. I have reviewed nursing notes and appropriate previous records.  I feel the patient is safe to be discharged home without further emergent workup and can continue workup as an outpatient as needed. Discussed usual and customary return precautions. Patient/family verbalize understanding and are comfortable with this plan.  Outpatient follow-up has been provided if needed. All questions have been answered.      EKG Interpretation  Date/Time:  Tuesday April 14 2017 21:14:47 EST Ventricular Rate:  73 PR Interval:    QRS Duration: 82 QT Interval:  351 QTC Calculation: 387 R Axis:   22 Text Interpretation:  Sinus rhythm No significant change since last tracing Confirmed by Shaune PollackIsaacs, [place] [phone]) on 04/14/2017 9:18:01 PM         Micayla Brathwaite, Layla MawKristen N, DO 04/15/17 0405

## 2017-04-15 NOTE — Discharge Instructions (Signed)
You may alternate Tylenol 1000 mg every 6 hours as needed for pain and Ibuprofen 800 mg every 8 hours as needed for pain.  Please take Ibuprofen with food. ° ° ° °To find a primary care or specialty doctor please call [phone] or [phone] to access "[place] Find a Doctor Service." ° °You may also go on the [facility] website at [email or web address] ° °There are also multiple [facility], [facility] and [facility] practices throughout the [place] that are frequently accepting new patients. You may find a clinic that is close to your home and contact them. ° ° [facility] -  °[address] °[place] [address] °[phone] ° ° °[facility] -  °[address] ° [place] [address] °[phone] ° ° °[facility] - °[address]  °[place] [address] °[phone] ° ° °

## 2018-04-19 ENCOUNTER — Emergency Department (HOSPITAL_COMMUNITY)
Admission: EM | Admit: 2018-04-19 | Discharge: 2018-04-20 | Disposition: A | Discharge status: Home / Self Care | Payer: Self-pay | Attending: Emergency Medicine | Admitting: Emergency Medicine

## 2018-04-19 ENCOUNTER — Encounter (HOSPITAL_COMMUNITY): Payer: Self-pay | Admitting: Emergency Medicine

## 2018-04-19 DIAGNOSIS — R197 Diarrhea, unspecified: Secondary | ICD-10-CM | POA: Insufficient documentation

## 2018-04-19 DIAGNOSIS — F17210 Nicotine dependence, cigarettes, uncomplicated: Secondary | ICD-10-CM | POA: Insufficient documentation

## 2018-04-19 DIAGNOSIS — R112 Nausea with vomiting, unspecified: Secondary | ICD-10-CM | POA: Insufficient documentation

## 2018-04-19 DIAGNOSIS — R1084 Generalized abdominal pain: Secondary | ICD-10-CM | POA: Insufficient documentation

## 2018-04-19 DIAGNOSIS — R6883 Chills (without fever): Secondary | ICD-10-CM | POA: Insufficient documentation

## 2018-04-19 LAB — COMPREHENSIVE METABOLIC PANEL
ALBUMIN: 3.4 g/dL — AB (ref 3.5–5.0)
ALK PHOS: 62 U/L (ref 38–126)
ALT: 15 U/L (ref 0–44)
ANION GAP: 13 (ref 5–15)
AST: 18 U/L (ref 15–41)
BILIRUBIN TOTAL: 0.4 mg/dL (ref 0.3–1.2)
BUN: 14 mg/dL (ref 6–20)
CALCIUM: 8.8 mg/dL — AB (ref 8.9–10.3)
CO2: 25 mmol/L (ref 22–32)
CREATININE: 1.22 mg/dL (ref 0.61–1.24)
Chloride: 102 mmol/L (ref 98–111)
GFR calc Af Amer: >60 mL/min (ref >60)
GFR calc non Af Amer: >60 mL/min (ref >60)
Glucose, Bld: 116 mg/dL — ABNORMAL HIGH (ref 70–99)
Potassium: 4 mmol/L (ref 3.5–5.1)
SODIUM: 140 mmol/L (ref 135–145)
Total Protein: 6.1 g/dL — ABNORMAL LOW (ref 6.5–8.1)

## 2018-04-19 LAB — CBC
HEMATOCRIT: 43.5 % (ref 39.0–52.0)
HEMOGLOBIN: 14.1 g/dL (ref 13.0–17.0)
MCH: 31.1 pg (ref 26.0–34.0)
MCHC: 32.4 g/dL (ref 30.0–36.0)
MCV: 96 fL (ref 80.0–100.0)
Platelets: 235 10*3/uL (ref 150–400)
RBC: 4.53 MIL/uL (ref 4.22–5.81)
RDW: 12.1 % (ref 11.5–15.5)
WBC: 8.7 10*3/uL (ref 4.0–10.5)
nRBC: 0 % (ref 0.0–0.2)

## 2018-04-19 LAB — URINALYSIS, ROUTINE W REFLEX MICROSCOPIC
Bilirubin Urine: NEGATIVE
GLUCOSE, UA: NEGATIVE mg/dL
HGB URINE DIPSTICK: NEGATIVE
Ketones, ur: NEGATIVE mg/dL
Leukocytes, UA: NEGATIVE
Nitrite: NEGATIVE
PH: 5 (ref 5.0–8.0)
Protein, ur: NEGATIVE mg/dL
SPECIFIC GRAVITY, URINE: 1.023 (ref 1.005–1.030)

## 2018-04-19 LAB — LIPASE, BLOOD: Lipase: 27 U/L (ref 11–51)

## 2018-04-19 NOTE — ED Provider Notes (Signed)
[facility]   CSN: [id number] Arrival date & time: 04/19/18  1756     History   Chief Complaint Chief Complaint  Patient presents with  . Abdominal Pain  . Emesis  . Diarrhea    HPI Francisco Lewis is a 47 y.o. male.  The history is provided by the patient. No language interpreter was used.  Abdominal Pain   Associated symptoms include diarrhea and vomiting.  Emesis   Associated symptoms include abdominal pain and diarrhea.  Diarrhea   Associated symptoms include abdominal pain and vomiting.     47 year old male presenting for evaluation of abdominal pain.  Patient reports 5 days ago he developed generalized abdominal crampy, with associated nausea vomiting and diarrhea.  States that every time that he eats or drink anything, he vomited up as well as having diarrhea.  Vomitus is nonbloody nonbilious, diarrhea is without blood or mucus.  He felt that he is "wiped out" and has been mostly bedbound.  He endorsed lightheadedness and dizziness with generalized fatigue.  States that he has not been urinating much and does not have much of an appetite.  He endorsed some nasal congestion.  Also endorsed having chills.  Denies having fever, severe headache, chest pain, shortness of breath, productive cough, dysuria, focal numbness or weakness.  He has not had his flu shot.  His grandchild was recently sick with similar symptoms.  Aside from DayQuil and rest, no other specific treatment tried.  History reviewed. No pertinent past medical history.  There are no active problems to display for this patient.   Past Surgical History:  Procedure Laterality Date  . ORTHOPEDIC SURGERY     right leg hardware        Home Medications    Prior to Admission medications   Medication Sig Start Date End Date Taking? Authorizing Provider  ibuprofen (ADVIL,MOTRIN) 800 MG tablet Take 1 tablet (800 mg total) by mouth 3 (three) times daily.  04/23/12   Sunnie Nielsenpitz, Brian, MD  ibuprofen (ADVIL,MOTRIN) 800 MG tablet Take 1 tablet (800 mg total) by mouth 3 (three) times daily. 02/05/17   Lurline IdolMurrill, Samantha, FNP    Family History No family history on file.  Social History Social History   Tobacco Use  . Smoking status: Current Every Day Smoker    Packs/day: 0.50    Types: Cigarettes  . Smokeless tobacco: Never Used  Substance Use Topics  . Alcohol use: No  . Drug use: No     Allergies   Apple and Shellfish allergy   Review of Systems Review of Systems  Gastrointestinal: Positive for abdominal pain, diarrhea and vomiting.  All other systems reviewed and are negative.    Physical Exam Updated Vital Signs BP 113/73 (BP Location: Right Arm)   Pulse 80   Temp 98.3 F (36.8 C) (Oral)   Resp 16   Ht 5\' 11"  (1.803 m)   Wt 90.7 kg   SpO2 99%   BMI 27.89 kg/m   Physical Exam  Constitutional: He appears well-developed and well-nourished. No distress.  HENT:  Head: Atraumatic.  Right Ear: External ear normal.  Left Ear: External ear normal.  Mouth/Throat: Oropharynx is clear and moist.  Eyes: Conjunctivae are normal.  Neck: Neck supple.  Cardiovascular: Normal rate and regular rhythm.  Pulmonary/Chest: Effort normal and breath sounds normal.  Abdominal: Soft. Bowel sounds are normal. There is tenderness (Very mild diffuse abdominal tenderness without focal point tenderness.).  Neurological: He is alert.  Skin: No rash noted.  Psychiatric: He has a normal mood and affect.  Nursing note and vitals reviewed.    ED Treatments / Results  Labs (all labs ordered are listed, but only abnormal results are displayed) Labs Reviewed  COMPREHENSIVE METABOLIC PANEL - Abnormal; Notable for the following components:      Result Value   Glucose, Bld 116 (*)    Calcium 8.8 (*)    Total Protein 6.1 (*)    Albumin 3.4 (*)    All other components within normal limits  LIPASE, BLOOD  CBC  URINALYSIS, ROUTINE W REFLEX  MICROSCOPIC    EKG None  Radiology No results found.  Procedures Procedures (including critical care time)  Medications Ordered in ED Medications  sodium chloride 0.9 % bolus 1,000 mL (0 mLs Intravenous Stopped 04/20/18 0145)     Initial Impression / Assessment and Plan / ED Course  I have reviewed the triage vital signs and the nursing notes.  Pertinent labs & imaging results that were available during my care of the patient were reviewed by me and considered in my medical decision making (see chart for details).     BP 116/89 (BP Location: Left Arm)   Pulse 73   Temp 97.9 F (36.6 C) (Oral)   Resp 15   Ht 5\' 11"  (1.803 m)   Wt 90.7 kg   SpO2 100%   BMI 27.89 kg/m    Final Clinical Impressions(s) / ED Diagnoses   Final diagnoses:  Nausea vomiting and diarrhea    ED Discharge Orders         Ordered    promethazine (PHENERGAN) 25 MG tablet  Every 6 hours PRN     04/20/18 0212         12:47 AM Patient here with abdominal pain nausea vomiting diarrhea and congestion.  Symptoms ongoing for the past for 5 days.  Suspect viral etiology causing his symptoms.  Labs are reassuring.  He is afebrile.  Abdominal exam unremarkable.  Will provide IV fluid and symptomatic treatment at this time.  2:12 AM After receiving IV fluid, patient reported he feels better.  He is now having appetite after able to eat some crackers and juice.  Patient is stable for discharge.  Return precautions discussed. Work note provided per request.    Fayrene Helper, PA-C 04/20/18 9604    Gilda Crease, MD 04/20/18 [phone]

## 2018-04-19 NOTE — ED Triage Notes (Signed)
Patient reports intermittent mid abdominal pain with emesis and diarrhea for 4 days with chills and fatigue , afebrile .

## 2018-04-20 MED ORDER — PROMETHAZINE HCL 25 MG PO TABS: 25.0000 mg | ORAL_TABLET | Freq: Four times a day (QID) | ORAL | 0 refills | Status: DC | PRN

## 2018-04-20 MED ORDER — SODIUM CHLORIDE 0.9 % IV BOLUS
1000.0000 mL | Freq: Once | INTRAVENOUS | Status: AC
  Administered 2018-04-20: 1000 mL via INTRAVENOUS

## 2018-04-20 NOTE — ED Notes (Signed)
Pt given ginger ale and graham crackers. Able to eat & drink without difficulty.

## 2018-07-02 ENCOUNTER — Encounter (HOSPITAL_COMMUNITY): Payer: Self-pay | Admitting: Family Medicine

## 2018-07-02 ENCOUNTER — Ambulatory Visit (HOSPITAL_COMMUNITY)
Admission: EM | Admit: 2018-07-02 | Discharge: 2018-07-02 | Disposition: A | Discharge status: Home / Self Care | Payer: Self-pay | Attending: Family Medicine | Admitting: Family Medicine

## 2018-07-02 DIAGNOSIS — J111 Influenza due to unidentified influenza virus with other respiratory manifestations: Secondary | ICD-10-CM

## 2018-07-02 MED ORDER — HYDROCOD POLST-CPM POLST ER 10-8 MG/5ML PO SUER: 5.0000 mL | Freq: Two times a day (BID) | ORAL | 0 refills | Status: DC | PRN

## 2018-07-02 MED ORDER — OSELTAMIVIR PHOSPHATE 75 MG PO CAPS: 75.0000 mg | ORAL_CAPSULE | Freq: Two times a day (BID) | ORAL | 0 refills | Status: DC

## 2018-07-02 MED ORDER — IBUPROFEN 800 MG PO TABS: 800.0000 mg | ORAL_TABLET | Freq: Three times a day (TID) | ORAL | 0 refills | Status: DC

## 2018-07-02 NOTE — ED Provider Notes (Signed)
[facility]    CSN: [id number] Arrival date & time: 07/02/18  1518     History   Chief Complaint Chief Complaint  Patient presents with  . Generalized Body Aches    HPI Francisco Lewis is a 49 y.o. male.   This is an established [facility] patient who presents with flulike symptoms.  Reports fever, chills, decreased energy, decreased appetite, and mild sob since last night.  Estate agent.     History reviewed. No pertinent past medical history.  There are no active problems to display for this patient.   Past Surgical History:  Procedure Laterality Date  . ORTHOPEDIC SURGERY     right leg hardware       Home Medications    Prior to Admission medications   Medication Sig Start Date End Date Taking? Authorizing Provider  chlorpheniramine-HYDROcodone (TUSSIONEX PENNKINETIC ER) 10-8 MG/5ML SUER Take 5 mLs by mouth every 12 (twelve) hours as needed for cough. 07/02/18   Elvina Sidle, MD  ibuprofen (ADVIL,MOTRIN) 800 MG tablet Take 1 tablet (800 mg total) by mouth 3 (three) times daily. 07/02/18   Elvina Sidle, MD  oseltamivir (TAMIFLU) 75 MG capsule Take 1 capsule (75 mg total) by mouth every 12 (twelve) hours. 07/02/18   Elvina Sidle, MD    Family History No family history on file.  Social History Social History   Tobacco Use  . Smoking status: Current Every Day Smoker    Packs/day: 0.50    Types: Cigarettes  . Smokeless tobacco: Never Used  Substance Use Topics  . Alcohol use: No  . Drug use: No     Allergies   Apple and Shellfish allergy   Review of Systems Review of Systems  Constitutional: Positive for appetite change, chills, fatigue and fever.  HENT: Positive for congestion. Negative for ear pain and sore throat.   Eyes: Negative for discharge.  Respiratory: Positive for cough, chest tightness and shortness of breath.   Musculoskeletal: Positive for myalgias.     Physical  Exam Triage Vital Signs ED Triage Vitals  Enc Vitals Group     BP      Pulse      Resp      Temp      Temp src      SpO2      Weight      Height      Head Circumference      Peak Flow      Pain Score      Pain Loc      Pain Edu?      Excl. in GC?    No data found.  Updated Vital Signs BP 109/67   Pulse 84   Temp 99.9 F (37.7 C)   Resp 16   SpO2 97%    Physical Exam Vitals signs and nursing note reviewed.  Constitutional:      Appearance: Normal appearance. He is ill-appearing.  HENT:     Right Ear: Tympanic membrane normal.     Left Ear: Tympanic membrane normal.     Nose:     Comments: Bilateral turbinates inflamed     Mouth/Throat:     Mouth: Mucous membranes are moist.     Pharynx: No oropharyngeal exudate or posterior oropharyngeal erythema.  Eyes:     Conjunctiva/sclera: Conjunctivae normal.  Neck:     Musculoskeletal: Normal range of motion and neck supple.  Cardiovascular:     Rate and Rhythm:  Normal rate and regular rhythm.  Pulmonary:     Breath sounds: Examination of the right-upper field reveals wheezing and rhonchi. Examination of the right-lower field reveals rhonchi. Wheezing and rhonchi present.  Neurological:     General: No focal deficit present.     Mental Status: He is alert and oriented to person, place, and time.   oral exam:  leukoplakia Chest exam recheck:  No rales or wheezes.  UC Treatments / Results  Labs (all labs ordered are listed, but only abnormal results are displayed) Labs Reviewed - No data to display  EKG None  Radiology No results found.  Procedures Procedures (including critical care time)  Medications Ordered in UC Medications - No data to display  Initial Impression / Assessment and Plan / UC Course  I have reviewed the triage vital signs and the nursing notes.  Pertinent labs & imaging results that were available during my care of the patient were reviewed by me and considered in my medical decision  making (see chart for details).    Final Clinical Impressions(s) / UC Diagnoses   Final diagnoses:  Influenza   Discharge Instructions   None    ED Prescriptions    Medication Sig Dispense Auth. Provider   ibuprofen (ADVIL,MOTRIN) 800 MG tablet Take 1 tablet (800 mg total) by mouth 3 (three) times daily. 21 tablet Elvina Sidle, MD   oseltamivir (TAMIFLU) 75 MG capsule Take 1 capsule (75 mg total) by mouth every 12 (twelve) hours. 10 capsule Elvina Sidle, MD   chlorpheniramine-HYDROcodone [facility] ER) 10-8 MG/5ML SUER Take 5 mLs by mouth every 12 (twelve) hours as needed for cough. 60 mL Elvina Sidle, MD     Controlled Substance Prescriptions [place] Controlled Substance Registry consulted? Not Applicable   Elvina Sidle, MD 07/02/18 [phone]

## 2018-07-02 NOTE — ED Triage Notes (Signed)
Pt c/o body aches x2 days, hot and cold chills.

## 2019-04-09 ENCOUNTER — Other Ambulatory Visit: Payer: Self-pay

## 2019-04-09 ENCOUNTER — Emergency Department (HOSPITAL_COMMUNITY)
Admission: EM | Admit: 2019-04-09 | Discharge: 2019-04-09 | Disposition: A | Discharge status: Home / Self Care | Payer: Self-pay | Attending: Emergency Medicine | Admitting: Emergency Medicine

## 2019-04-09 ENCOUNTER — Encounter (HOSPITAL_COMMUNITY): Payer: Self-pay

## 2019-04-09 DIAGNOSIS — M79605 Pain in left leg: Secondary | ICD-10-CM

## 2019-04-09 DIAGNOSIS — F17210 Nicotine dependence, cigarettes, uncomplicated: Secondary | ICD-10-CM | POA: Insufficient documentation

## 2019-04-09 DIAGNOSIS — L252 Unspecified contact dermatitis due to dyes: Secondary | ICD-10-CM | POA: Insufficient documentation

## 2019-04-09 DIAGNOSIS — M79662 Pain in left lower leg: Secondary | ICD-10-CM | POA: Insufficient documentation

## 2019-04-09 DIAGNOSIS — L2489 Irritant contact dermatitis due to other agents: Secondary | ICD-10-CM

## 2019-04-09 LAB — BASIC METABOLIC PANEL
Anion gap: 8 (ref 5–15)
BUN: 13 mg/dL (ref 6–20)
CO2: 24 mmol/L (ref 22–32)
Calcium: 8.5 mg/dL — ABNORMAL LOW (ref 8.9–10.3)
Chloride: 107 mmol/L (ref 98–111)
Creatinine, Ser: 0.99 mg/dL (ref 0.61–1.24)
GFR calc Af Amer: >60 mL/min (ref >60)
GFR calc non Af Amer: >60 mL/min (ref >60)
Glucose, Bld: 113 mg/dL — ABNORMAL HIGH (ref 70–99)
Potassium: 4.6 mmol/L (ref 3.5–5.1)
Sodium: 139 mmol/L (ref 135–145)

## 2019-04-09 LAB — CBC WITH DIFFERENTIAL/PLATELET
Abs Immature Granulocytes: 0.02 10*3/uL (ref 0.00–0.07)
Basophils Absolute: 0 10*3/uL (ref 0.0–0.1)
Basophils Relative: 0 %
Eosinophils Absolute: 0.6 10*3/uL — ABNORMAL HIGH (ref 0.0–0.5)
Eosinophils Relative: 6 %
HCT: 44.5 % (ref 39.0–52.0)
Hemoglobin: 15 g/dL (ref 13.0–17.0)
Immature Granulocytes: 0 %
Lymphocytes Relative: 13 %
Lymphs Abs: 1.3 10*3/uL (ref 0.7–4.0)
MCH: 32.2 pg (ref 26.0–34.0)
MCHC: 33.7 g/dL (ref 30.0–36.0)
MCV: 95.5 fL (ref 80.0–100.0)
Monocytes Absolute: 0.8 10*3/uL (ref 0.1–1.0)
Monocytes Relative: 9 %
Neutro Abs: 6.9 10*3/uL (ref 1.7–7.7)
Neutrophils Relative %: 72 %
Platelets: 185 10*3/uL (ref 150–400)
RBC: 4.66 MIL/uL (ref 4.22–5.81)
RDW: 12.1 % (ref 11.5–15.5)
WBC: 9.6 10*3/uL (ref 4.0–10.5)
nRBC: 0 % (ref 0.0–0.2)

## 2019-04-09 MED ORDER — PREDNISONE 10 MG (21) PO TBPK: ORAL_TABLET | Freq: Every day | ORAL | 0 refills | Status: DC

## 2019-04-09 MED ORDER — APIXABAN 5 MG PO TABS
10.0000 mg | ORAL_TABLET | Freq: Once | ORAL | Status: AC
  Administered 2019-04-09: 10 mg via ORAL
  Filled 2019-04-09: qty 2

## 2019-04-09 NOTE — ED Triage Notes (Signed)
Pt reports allergic reaction to hair dye. Redness, swelling and itching noted in the scalp since yesterday. Pt also reports left lower leg pain for the past 7 days he would like to be evaluated for. Denies hx of blood clots. Pt ambulatory.
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
## 2019-04-09 NOTE — Discharge Instructions (Signed)
It is important that you return tomorrow for your DVT study. You have given a dose of Eliquis tonight which is a blood thinner.  We discussed the risks of this. I have given you a prescription for steroids.  It is important that you do not suddenly stop taking steroids as this can cause severe complications.  Please continue to take your steroids as prescribed for the entire course. You may take 1 to 2 pills of Benadryl (25 to 50 mg) of Benadryl every 6 hours as needed for itching.  Benadryl may make you sleepy. For 24 hours after one dose please do not drive, operate heavy machinery, care for a small child with out another adult present, or perform any activities that may cause harm to you or someone else if you were to fall asleep or be impaired.

## 2019-04-10 ENCOUNTER — Ambulatory Visit (HOSPITAL_COMMUNITY): Payer: Self-pay | Attending: Emergency Medicine

## 2019-04-11 ENCOUNTER — Ambulatory Visit (HOSPITAL_COMMUNITY)
Admission: RE | Admit: 2019-04-11 | Discharge: 2019-04-11 | Disposition: A | Discharge status: Home / Self Care | Payer: Self-pay | Source: Ambulatory Visit | Attending: Physician Assistant | Admitting: Physician Assistant

## 2019-04-11 ENCOUNTER — Emergency Department (HOSPITAL_COMMUNITY)
Admission: EM | Admit: 2019-04-11 | Discharge: 2019-04-11 | Disposition: A | Discharge status: Home / Self Care | Payer: Self-pay | Attending: Emergency Medicine | Admitting: Emergency Medicine

## 2019-04-11 ENCOUNTER — Other Ambulatory Visit: Payer: Self-pay

## 2019-04-11 ENCOUNTER — Encounter (HOSPITAL_COMMUNITY): Payer: Self-pay | Admitting: Emergency Medicine

## 2019-04-11 DIAGNOSIS — F17210 Nicotine dependence, cigarettes, uncomplicated: Secondary | ICD-10-CM | POA: Insufficient documentation

## 2019-04-11 DIAGNOSIS — M79605 Pain in left leg: Secondary | ICD-10-CM | POA: Insufficient documentation

## 2019-04-11 DIAGNOSIS — M79609 Pain in unspecified limb: Secondary | ICD-10-CM

## 2019-04-11 DIAGNOSIS — I824Y2 Acute embolism and thrombosis of unspecified deep veins of left proximal lower extremity: Secondary | ICD-10-CM | POA: Insufficient documentation

## 2019-04-11 MED ORDER — APIXABAN 5 MG PO TABS
10.0000 mg | ORAL_TABLET | Freq: Two times a day (BID) | ORAL | Status: DC
  Administered 2019-04-11: 10 mg via ORAL
  Filled 2019-04-11 (×2): qty 2

## 2019-04-11 MED ORDER — APIXABAN (ELIQUIS) VTE STARTER PACK (10MG AND 5MG): ORAL_TABLET | ORAL | 0 refills | Status: DC

## 2019-04-11 MED ORDER — APIXABAN 5 MG PO TABS: 5.0000 mg | ORAL_TABLET | Freq: Two times a day (BID) | ORAL | Status: DC
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
## 2019-04-11 NOTE — Discharge Planning (Signed)
Francisco Lewis J. Clydene Laming, RN, BSN, [place]  [facility] set up appointment with [facility] on 12/16 @ [phone].

## 2019-04-11 NOTE — ED Notes (Signed)
Pharmacy at the bedside educating pt on Eliquis.

## 2019-04-11 NOTE — Progress Notes (Signed)
Left lower extremity venous duplex has been completed. Preliminary results can be found in CV Proc through chart review.  Results were given to the charge nurse, Deneise Lever.  04/11/19 11:32 AM Carlos Levering RVT

## 2019-04-11 NOTE — Discharge Instructions (Signed)
TAKE MEDICATION AS PRESCRIBED. FOLLOW UP AT [place] ON 12/16 APPOINTMENT FOR A REFILL. DO NOT TAKE IBUPROFEN, MOTRIN, ALEVE, ASPIRIN, OR ADVIL. GO DIRECTLY TO THE ER IF YOU HAVE SEVERE BLEEDING OR A HEAD INJURY. ALSO GO TO ER IF YOU HAVE CHEST PAIN OR BREATHING PROBLEMS.

## 2019-04-11 NOTE — ED Provider Notes (Signed)
[facility] EMERGENCY DEPARTMENT Provider Note   CSN: [id number] Arrival date & time: 04/11/19  1131     History   Chief Complaint Chief Complaint  Patient presents with   dvt    HPI Francisco Lewis is a 48 y.o. male.     48 year old male who presents with DVT.  The patient was evaluated here on 11/21 for a skin reaction and also mentioned left lower leg swelling and pain.  He was scheduled for an ultrasound later given the time of night.  This morning he returned for his ultrasound and was found to have a DVT.  He continues to have mild pain in his posterior calf but denies any severe pain.  He denies any chest pain, shortness of breath, or breathing problems whatsoever.    He denies recent immobilization, recent surgery, long travel, or history of blood clots. No bleeding problems.   The history is provided by the patient.    History reviewed. No pertinent past medical history.  There are no active problems to display for this patient.   Past Surgical History:  Procedure Laterality Date   ORTHOPEDIC SURGERY     right leg hardware        Home Medications    Prior to Admission medications   Medication Sig Start Date End Date Taking? Authorizing Provider  Apixaban Starter Pack (ELIQUIS STARTER PACK) 5 MG TBPK Take as directed on package: start with two-5mg  tablets twice daily for 7 days. On day 8, switch to one-5mg  tablet twice daily. 04/11/19   Elius Etheredge, Ambrose Finland, MD  predniSONE (STERAPRED UNI-PAK 21 TAB) 10 MG (21) TBPK tablet Take by mouth daily. Take 6 tabs by mouth daily  for 2 days, then 5 tabs for 2 days, then 4 tabs for 2 days, then 3 tabs for 2 days, 2 tabs for 2 days, then 1 tab by mouth daily for 2 days 04/09/19   Cristina Gong, PA-C    Family History No family history on file.  Social History Social History   Tobacco Use   Smoking status: Current Every Day Smoker    Packs/day: 0.50    Types: Cigarettes   Smokeless  tobacco: Never Used  Substance Use Topics   Alcohol use: No   Drug use: No     Allergies   Apple and Shellfish allergy   Review of Systems Review of Systems All other systems reviewed and are negative except that which was mentioned in HPI   Physical Exam Updated Vital Signs BP 111/66    Pulse 72    Temp 98.5 F (36.9 C) (Oral)    Resp 16    Ht 5\' 11"  (1.803 m)    Wt 90.7 kg    SpO2 95%    BMI 27.89 kg/m   Physical Exam Vitals signs and nursing note reviewed.  Constitutional:      General: He is not in acute distress.    Appearance: He is well-developed.  HENT:     Head: Normocephalic and atraumatic.  Eyes:     Conjunctiva/sclera: Conjunctivae normal.  Neck:     Musculoskeletal: Neck supple.  Pulmonary:     Effort: Pulmonary effort is normal.  Musculoskeletal: Normal range of motion.        General: Swelling present.     Comments: Mild L calf swelling, no significant tenderness  Skin:    General: Skin is warm and dry.     Findings: No erythema.  Neurological:  Mental Status: He is alert and oriented to person, place, and time.  Psychiatric:        Judgment: Judgment normal.      ED Treatments / Results  Labs (all labs ordered are listed, but only abnormal results are displayed) Labs Reviewed - No data to display  EKG None  Radiology Le Venous  Result Date: 04/11/2019  Lower Venous Study Indications: Pain.  Risk Factors: None identified. Comparison Study: No prior studies. Performing Technologist: Oliver Hum RVT  Examination Guidelines: A complete evaluation includes B-mode imaging, spectral Doppler, color Doppler, and power Doppler as needed of all accessible portions of each vessel. Bilateral testing is considered an integral part of a complete examination. Limited examinations for reoccurring indications may be performed as noted.  +-----+---------------+---------+-----------+----------+--------------+   RIGHT Compressibility Phasicity Spontaneity Properties Thrombus Aging  +-----+---------------+---------+-----------+----------+--------------+  CFV   Full            Yes       Yes                                    +-----+---------------+---------+-----------+----------+--------------+   +---------+---------------+---------+-----------+----------+--------------+  LEFT      Compressibility Phasicity Spontaneity Properties Thrombus Aging  +---------+---------------+---------+-----------+----------+--------------+  CFV       Full            No        No                                     +---------+---------------+---------+-----------+----------+--------------+  SFJ       Full                                                             +---------+---------------+---------+-----------+----------+--------------+  FV Prox   Full                                                             +---------+---------------+---------+-----------+----------+--------------+  FV Mid    Full                                                             +---------+---------------+---------+-----------+----------+--------------+  FV Distal Partial         No        No                     Acute           +---------+---------------+---------+-----------+----------+--------------+  PFV       Full                                                             +---------+---------------+---------+-----------+----------+--------------+  POP       Partial         No        No                     Acute           +---------+---------------+---------+-----------+----------+--------------+  PTV       Full                                                             +---------+---------------+---------+-----------+----------+--------------+  PERO      None                                             Acute           +---------+---------------+---------+-----------+----------+--------------+  Gastroc   Full                                                              +---------+---------------+---------+-----------+----------+--------------+  SSV       Full                                                             +---------+---------------+---------+-----------+----------+--------------+     Summary: Right: No evidence of common femoral vein obstruction. Left: Findings consistent with acute deep vein thrombosis involving the left femoral vein, left popliteal vein, and left peroneal veins. No cystic structure found in the popliteal fossa.  *See table(s) above for measurements and observations. Electronically signed by Gretta Began MD on 04/11/2019 at 1:31:55 PM.    Final     Procedures Procedures (including critical care time)  Medications Ordered in ED Medications  apixaban (ELIQUIS) tablet 10 mg (10 mg Oral Given 04/11/19 1406)    Followed by  apixaban (ELIQUIS) tablet 5 mg (has no administration in time range)     Initial Impression / Assessment and Plan / ED Course  I have reviewed the triage vital signs and the nursing notes.  Pertinent labs & imaging results that were available during my care of the patient were reviewed by me and considered in my medical decision making (see chart for details).       Recent creatinine from visit on 11/21 normal. Pt uninsured, therefore contacted CM to arrange clinic f/u at [facility]. Camille, CM, was able to arrange appointment in December for follow up. Contacted pharmacist who provided w/ Rx education, starter pack, and voucher. Pt to f/u with [facility] for refill. I have extensively reviewed precautions with med and w/ regards to any chest pain or SOB. He voiced understanding.   Final Clinical Impressions(s) / ED Diagnoses   Final diagnoses:  Acute deep vein thrombosis (DVT) of proximal vein of left lower extremity [facility])    ED Discharge Orders  Ordered    Apixaban Starter Pack (ELIQUIS STARTER PACK) 5 MG TBPK     04/11/19 1316           Dee Paden, Ambrose Finlandachel Morgan, MD 04/11/19 1441

## 2019-05-04 ENCOUNTER — Inpatient Hospital Stay (INDEPENDENT_AMBULATORY_CARE_PROVIDER_SITE_OTHER): Payer: Self-pay | Admitting: Primary Care

## 2019-05-11 ENCOUNTER — Inpatient Hospital Stay (INDEPENDENT_AMBULATORY_CARE_PROVIDER_SITE_OTHER): Payer: Self-pay | Admitting: Primary Care

## 2019-06-06 ENCOUNTER — Inpatient Hospital Stay (INDEPENDENT_AMBULATORY_CARE_PROVIDER_SITE_OTHER): Payer: Self-pay | Admitting: Primary Care

## 2021-12-06 ENCOUNTER — Other Ambulatory Visit: Payer: Self-pay

## 2021-12-06 ENCOUNTER — Emergency Department (HOSPITAL_COMMUNITY)
Admission: EM | Admit: 2021-12-06 | Discharge: 2021-12-06 | Disposition: A | Discharge status: Home / Self Care | Payer: Self-pay | Attending: Emergency Medicine | Admitting: Emergency Medicine

## 2021-12-06 ENCOUNTER — Emergency Department (HOSPITAL_COMMUNITY): Payer: Self-pay

## 2021-12-06 ENCOUNTER — Encounter (HOSPITAL_COMMUNITY): Payer: Self-pay

## 2021-12-06 DIAGNOSIS — F1910 Other psychoactive substance abuse, uncomplicated: Secondary | ICD-10-CM | POA: Insufficient documentation

## 2021-12-06 DIAGNOSIS — Z7901 Long term (current) use of anticoagulants: Secondary | ICD-10-CM | POA: Insufficient documentation

## 2021-12-06 DIAGNOSIS — R0602 Shortness of breath: Secondary | ICD-10-CM | POA: Insufficient documentation

## 2021-12-06 NOTE — ED Triage Notes (Signed)
Pt bib ems for SOB d/t heroin use about 45 minutes ago.  Pt states he got hot and then couldn't breathe in.  Pt stable upon arrival to ed and sitting in chair.  Pt ambulated from ambulance to ed room.

## 2021-12-06 NOTE — ED Provider Notes (Signed)
[facility]-EMERGENCY DEPT Provider Note   CSN: [id number] Arrival date & time: 12/06/21  1756     History  Chief Complaint  Patient presents with   Shortness of Breath   Drug Problem    Francisco Lewis is a 51 y.o. male.  Patient here for shortness of breath after snorting heroin.  Feeling better now.  He got hot and flushed and felt like he could not breathe but with EMS his vital signs are normal.  Did not get Narcan.  Has no symptoms now.  Nothing made it worse or better.  Denies any chest pain, abdominal pain, nausea, vomiting, diarrhea.  The history is provided by the patient.       Home Medications Prior to Admission medications   Medication Sig Start Date End Date Taking? Authorizing Provider  Apixaban Starter Pack (ELIQUIS STARTER PACK) 5 MG TBPK Take as directed on package: start with two-5mg  tablets twice daily for 7 days. On day 8, switch to one-5mg  tablet twice daily. 04/11/19   Little, Ambrose Finland, MD  predniSONE (STERAPRED UNI-PAK 21 TAB) 10 MG (21) TBPK tablet Take by mouth daily. Take 6 tabs by mouth daily  for 2 days, then 5 tabs for 2 days, then 4 tabs for 2 days, then 3 tabs for 2 days, 2 tabs for 2 days, then 1 tab by mouth daily for 2 days 04/09/19   Cristina Gong, PA-C      Allergies    Apple juice and Shellfish allergy    Review of Systems   Review of Systems  Physical Exam Updated Vital Signs BP 110/68 (BP Location: Left Arm)   Pulse 79   Temp 97.9 F (36.6 C) (Oral)   Resp 18   Ht 5\' 11"  (1.803 m)   Wt 85.7 kg   SpO2 94%   BMI 26.36 kg/m  Physical Exam Vitals and nursing note reviewed.  Constitutional:      General: He is not in acute distress.    Appearance: He is well-developed.  HENT:     Head: Normocephalic and atraumatic.     Mouth/Throat:     Mouth: Mucous membranes are moist.  Eyes:     Extraocular Movements: Extraocular movements intact.     Conjunctiva/sclera: Conjunctivae normal.     Pupils:  Pupils are equal, round, and reactive to light.  Cardiovascular:     Rate and Rhythm: Normal rate and regular rhythm.     Pulses: Normal pulses.     Heart sounds: Normal heart sounds. No murmur heard. Pulmonary:     Effort: Pulmonary effort is normal. No respiratory distress.     Breath sounds: Normal breath sounds. No decreased breath sounds.  Abdominal:     Palpations: Abdomen is soft.     Tenderness: There is no abdominal tenderness.  Musculoskeletal:        General: No swelling.     Cervical back: Normal range of motion and neck supple.  Skin:    General: Skin is warm and dry.     Capillary Refill: Capillary refill takes less than 2 seconds.  Neurological:     Mental Status: He is alert.  Psychiatric:        Mood and Affect: Mood normal.     ED Results / Procedures / Treatments   Labs (all labs ordered are listed, but only abnormal results are displayed) Labs Reviewed - No data to display  EKG None  Radiology DG Chest Portable 1 View  Result Date: 12/06/2021 CLINICAL DATA:  sob EXAM: PORTABLE CHEST 1 VIEW COMPARISON:  Chest x-ray 04/14/2017. FINDINGS: The heart and mediastinal contours are within normal limits. No focal consolidation. No pulmonary edema. No pleural effusion. No pneumothorax. No acute osseous abnormality. IMPRESSION: No active disease. Electronically Signed   By: Tish Frederickson M.D.   On: 12/06/2021 18:41    Procedures Procedures    Medications Ordered in ED Medications - No data to display  ED Course/ Medical Decision Making/ A&P                           Medical Decision Making Amount and/or Complexity of Data Reviewed Radiology: ordered.   [name] is here after not feeling well after snorting heroin.  Normal vitals.  No fever.  Overall he is well-appearing.  Not somnolent.  He states that he snorted some heroin and started to feel short of breath.  Feeling better now.  Did not get Narcan in the field.  Chest x-ray was ordered and per  my review and interpretation shows no pneumonia or pneumothorax.  Patient was observed in the ED without any signs of respiratory distress or depression.  He has people with him now and overall he appears safe for discharge.  Neurologically he is intact.  No signs of respiratory distress and.  Given resources for rehab.  Discharged.  This chart was dictated using voice recognition software.  Despite best efforts to proofread,  errors can occur which can change the documentation meaning.         Final Clinical Impression(s) / ED Diagnoses Final diagnoses:  Shortness of breath  Substance abuse [facility])    Rx / DC Orders ED Discharge Orders     None         Virgina Norfolk, DO 12/06/21 1943

## 2022-10-10 ENCOUNTER — Ambulatory Visit (HOSPITAL_COMMUNITY)
Admission: EM | Admit: 2022-10-10 | Discharge: 2022-10-10 | Disposition: A | Discharge status: Home / Self Care | Payer: BLUE CROSS/BLUE SHIELD

## 2022-10-10 DIAGNOSIS — F1910 Other psychoactive substance abuse, uncomplicated: Secondary | ICD-10-CM | POA: Diagnosis not present

## 2022-10-10 NOTE — Progress Notes (Signed)
   10/10/22 1454  [place] Suicide Severity Rating Scale  1. Wish to be Dead No  2. Suicidal Thoughts No  6. Suicide Behavior Question No  C-SSRS RISK CATEGORY No Risk

## 2022-10-10 NOTE — Progress Notes (Signed)
   10/10/22 1447  Vital Signs  Temp (!) 97.5 F (36.4 C)  Temp Source Oral  Pulse Rate 98  Pulse Rate Source Dinamap  Resp 20  BP 119/81  BP Location Left Arm  BP Method Automatic  Patient Position (if appropriate) Sitting  Oxygen Therapy  SpO2 95 %  O2 Device Room Air

## 2022-10-10 NOTE — ED Triage Notes (Signed)
Pt presents to [facility] voluntarily. Pt reports struggling with addiction and drug use. Pt reports using cocaine,fentanyl, heroin. Pt reports daily use of either drug he can get. Pt reports experincing depression and low motivation. Pt reports wanting to quit using substances and detox and receive treatment for addiction. Pt is urgent.

## 2022-10-10 NOTE — Progress Notes (Signed)
   10/10/22 1448  [facility] Triage Screening (Walk-ins at [facility] only)  What Is the Reason for Your Visit/Call Today? Pt presents to [facility] voluntarily. Pt reports struggling with addiction and drug use. Pt reports using cocaine,fentanyl, heroin. Pt reports daily use of either drug he can get. Pt reports experincing depression and low motivation.  Pt reports wanting to quit using substances and detox and receive treatment for addiction. Pt is urgent.  How Long Has This Been Causing You Problems? 1 wk - 1 month  Have You Recently Had Any Thoughts About Hurting Yourself? No  Are You Planning to Commit Suicide/Harm Yourself At This time? No  Have you Recently Had Thoughts About Hurting Someone Karolee Ohs? No  Are You Planning To Harm Someone At This Time? No  Are you currently experiencing any auditory, visual or other hallucinations? Yes  Please explain the hallucinations you are currently experiencing: When using substances both auditory and visual.  Have You Used Any Alcohol or Drugs in the Past 24 Hours? Yes  How long ago did you use Drugs or Alcohol? 12 hours ago  What Did You Use and How Much? Cocaine- over a gram  Do you have any current medical co-morbidities that require immediate attention? No  Clinician description of patient physical appearance/behavior: Groomed, calm  What Do You Feel Would Help You the Most Today? Alcohol or Drug Use Treatment  If access to [facility] Urgent [facility] was not available, would you have sought care in the Emergency Department? Yes  Determination of Need Urgent (48 hours)  Options For Referral Facility-Based Crisis;Therapeutic Triage Services

## 2022-10-10 NOTE — ED Provider Notes (Signed)
Behavioral Health Urgent Care Medical Screening Exam  Patient Name: Francisco Lewis MRN: [id number] Date of Evaluation: 10/10/22 Chief Complaint: "drugs" Diagnosis:  Final diagnoses:  Polysubstance abuse (HCC)   History of Present illness: Francisco Lewis is a 52 y.o. male. Pt presents voluntarily to [facility] behavioral health for walk-in assessment.  Pt is accompanied by his wife, [name], who remains with pt throughout the assessment as per pt verbal consent/request. Pt is assessed face-to-face by nurse practitioner.   Norm Salt, 52 y.o., male patient seen face to face by this provider; and chart reviewed on 10/10/22.    On evaluation [name] reports presenting today for "drugs". Reports he is interested in detox and residential substance use treatment. Reports using crack, powder cocaine, fentanyl, heroin, marijuana, and cigarettes. Denies use of alcohol, methamphetamines, amphetamines, pcp, other substances. Denies history of withdrawal seizures, delirium tremens.  Reports using crack daily for the past 8 days, last use was yesterday, unsure of amount used. Reports using powder cocaine daily this month, last use was 12 hours ago, 1 g shared between 2 people. Reports using fentanyl twice this moth, last use last week, "couple toots". Reports using heroin once/week, last use was yesterday, 0.5g by himself. States "don't really smoke weed". Last use of marijuana 1 month ago, unsure of amount use. Reports use of cigarettes daily, between 8 cigarettes to 1 to 2 packs/day. Last use of cigarettes was today. Pt denies intravenous drug use.   Pt reports he receives suboxone from Dr. Orvan Falconer. Last took suboxone today. Pt denies use of other medications.  Pt denies suicidal, homicidal or violent ideations. He denies auditory visual hallucinations or paranoia. He reports experiences paranoia when using substances.  Pt denies history of non suicidal self injurious behavior, suicide  attempt or inpatient psychiatric hospitalization.  Pt reports he is currently homeless due to substance use. He states that he was staying with his mother although was not able to stay with her due to continued substance use.  Pt works in Dietitian at the [employer]  Discussed w/ pt and Rhonda admission to facility based crisis for detox and with assistance getting connected with residential substance use treatment. Pt declined and stated he would look into other facilities. Would return if he changed his mind. Substance use treatment resources given to pt.   Flowsheet Row ED from 10/10/2022 in [facility] ED from 12/06/2021 in [facility] Emergency Department at [facility]  C-SSRS RISK CATEGORY No Risk No Risk       Psychiatric Specialty Exam  Presentation  General Appearance:Appropriate for Environment; Casual; Fairly Groomed  Eye Contact:Fair  Speech:Clear and Coherent; Normal Rate  Speech Volume:Normal  Handedness:Right   Mood and Affect  Mood:Depressed  Affect:Blunt   Thought Process  Thought Processes:Coherent; Goal Directed; Linear  Descriptions of Associations:Intact  Orientation:Full (Time, Place and Person)  Thought Content:Logical    Hallucinations:None  Ideas of Reference:None  Suicidal Thoughts:No  Homicidal Thoughts:No   Sensorium  Memory:Immediate Fair; Recent Fair  Judgment:Intact  Insight:Fair   Executive Functions  Concentration:Fair  Attention Span:Fair  Recall:Fair  Fund of Knowledge:Fair  Language:Fair   Psychomotor Activity  Psychomotor Activity:Normal   Assets  Assets:Communication Skills; Desire for Improvement; Financial Resources/Insurance; Physical Health; Resilience; Social Support; Vocational/Educational   Sleep  Sleep:Poor  Number of hours: 0 (pt states he is not sure)   Physical Exam: Physical Exam Constitutional:      General: He is not in acute  distress.  Appearance: He is not ill-appearing, toxic-appearing or diaphoretic.  Eyes:     General: No scleral icterus. Cardiovascular:     Rate and Rhythm: Normal rate.  Pulmonary:     Effort: Pulmonary effort is normal. No respiratory distress.  Neurological:     Mental Status: He is alert and oriented to person, place, and time.  Psychiatric:        Attention and Perception: Attention and perception normal.        Mood and Affect: Mood is depressed. Affect is blunt.        Speech: Speech normal.        Behavior: Behavior normal. Behavior is cooperative.        Thought Content: Thought content normal.        Cognition and Memory: Cognition and memory normal.        Judgment: Judgment normal.    Review of Systems  Constitutional:  Negative for chills and fever.  Respiratory:  Negative for shortness of breath.   Cardiovascular:  Negative for chest pain and palpitations.  Gastrointestinal:  Negative for abdominal pain.  Neurological:  Negative for headaches.  Psychiatric/Behavioral:  Positive for depression and substance abuse.    Blood pressure 119/81, pulse 98, temperature (!) 97.5 F (36.4 C), temperature source Oral, resp. rate 20, SpO2 95 %. There is no height or weight on file to calculate BMI.  Musculoskeletal: Strength & Muscle Tone: within normal limits Gait & Station: normal Patient leans: N/A   [facility] MSE Discharge Disposition for Follow up and Recommendations: Based on my evaluation the patient does not appear to have an emergency medical condition and can be discharged with resources and follow up care in outpatient services for substance use treatment   Lauree Chandler, NP 10/10/2022, 4:42 PM

## 2022-10-10 NOTE — Discharge Instructions (Addendum)
Naloxone (Narcan) can help reverse an overdose when given to the victim quickly.  [place] offers free naloxone kits and instructions/training on its use.  Add naloxone to your first aid kit and you can help save a life.   Pick up your free kit at the following locations.   [place]:  [facility], [address] [place] [address] ([phone]) [facility] [address] [place] [id number] ([phone]) [facility] [address] [place] [address]  [place]: [facility] [address] [place] [address] ([phone]) [facility] [address] [place] [address] ([phone]) 

## 2023-01-11 ENCOUNTER — Encounter (HOSPITAL_BASED_OUTPATIENT_CLINIC_OR_DEPARTMENT_OTHER): Payer: Self-pay | Admitting: Emergency Medicine

## 2023-01-11 ENCOUNTER — Other Ambulatory Visit: Payer: Self-pay

## 2023-01-11 ENCOUNTER — Emergency Department (HOSPITAL_BASED_OUTPATIENT_CLINIC_OR_DEPARTMENT_OTHER): Payer: BLUE CROSS/BLUE SHIELD

## 2023-01-11 ENCOUNTER — Emergency Department (HOSPITAL_BASED_OUTPATIENT_CLINIC_OR_DEPARTMENT_OTHER)
Admission: EM | Admit: 2023-01-11 | Discharge: 2023-01-11 | Disposition: A | Discharge status: Home / Self Care | Payer: BLUE CROSS/BLUE SHIELD | Source: Home / Self Care | Attending: Emergency Medicine | Admitting: Emergency Medicine

## 2023-01-11 DIAGNOSIS — R519 Headache, unspecified: Secondary | ICD-10-CM | POA: Insufficient documentation

## 2023-01-11 LAB — CBC WITH DIFFERENTIAL/PLATELET
Abs Immature Granulocytes: 0.01 10*3/uL (ref 0.00–0.07)
Basophils Absolute: 0 10*3/uL (ref 0.0–0.1)
Basophils Relative: 0 %
Eosinophils Absolute: 0.3 10*3/uL (ref 0.0–0.5)
Eosinophils Relative: 5 %
HCT: 40.4 % (ref 39.0–52.0)
Hemoglobin: 13.7 g/dL (ref 13.0–17.0)
Immature Granulocytes: 0 %
Lymphocytes Relative: 30 %
Lymphs Abs: 2 10*3/uL (ref 0.7–4.0)
MCH: 31.1 pg (ref 26.0–34.0)
MCHC: 33.9 g/dL (ref 30.0–36.0)
MCV: 91.8 fL (ref 80.0–100.0)
Monocytes Absolute: 0.6 10*3/uL (ref 0.1–1.0)
Monocytes Relative: 9 %
Neutro Abs: 3.7 10*3/uL (ref 1.7–7.7)
Neutrophils Relative %: 56 %
Platelets: 239 10*3/uL (ref 150–400)
RBC: 4.4 MIL/uL (ref 4.22–5.81)
RDW: 13 % (ref 11.5–15.5)
WBC: 6.7 10*3/uL (ref 4.0–10.5)
nRBC: 0 % (ref 0.0–0.2)

## 2023-01-11 LAB — BASIC METABOLIC PANEL
Anion gap: 7 (ref 5–15)
BUN: 16 mg/dL (ref 6–20)
CO2: 26 mmol/L (ref 22–32)
Calcium: 8.2 mg/dL — ABNORMAL LOW (ref 8.9–10.3)
Chloride: 106 mmol/L (ref 98–111)
Creatinine, Ser: 0.96 mg/dL (ref 0.61–1.24)
GFR, Estimated: >60 mL/min (ref >60)
Glucose, Bld: 142 mg/dL — ABNORMAL HIGH (ref 70–99)
Potassium: 4 mmol/L (ref 3.5–5.1)
Sodium: 139 mmol/L (ref 135–145)

## 2023-01-11 LAB — SEDIMENTATION RATE: Sed Rate: 5 mm/hr (ref 0–16)

## 2023-01-11 NOTE — ED Triage Notes (Signed)
Pt awoke today with left side head (near temple )pain ,hurts inside and outside to the touch.no obvious rash noted.

## 2023-01-11 NOTE — ED Triage Notes (Signed)
No neuro changes per pt

## 2023-01-11 NOTE — ED Provider Notes (Signed)
[place] EMERGENCY DEPARTMENT AT [facility]  Provider Note  CSN: [id number] Arrival date & time: 01/11/23 1816  History Chief Complaint  Patient presents with   Headache    Francisco Lewis is a 52 y.o. male with no significant PMH reports he woke up around 1100hrs today to get ready to go to work and noticed some pain on the left side of his head. Worsened after he got to work around 1400hrs and described as a sharp, throbbing pain in his Left temple which was also tender to palpation then. He reports he took 600mg  Motrin which he did not think helped initially but he is feeling better now. No vision changes.    Home Medications Prior to Admission medications   Medication Sig Start Date End Date Taking? Authorizing Provider  Buprenorphine HCl-Naloxone HCl 12-3 MG FILM Place 1 Film under the tongue 2 (two) times daily. 09/29/22   [provider]     Allergies    Apple juice and Shellfish allergy   Review of Systems   Review of Systems Please see HPI for pertinent positives and negatives  Physical Exam BP 106/68   Pulse 67   Temp 98.2 F (36.8 C) (Oral)   Resp 20   SpO2 97%   Physical Exam Vitals and nursing note reviewed.  Constitutional:      Appearance: Normal appearance.  HENT:     Head: Normocephalic and atraumatic.     Comments: Mild tenderness L temple    Nose: Nose normal.     Mouth/Throat:     Mouth: Mucous membranes are moist.  Eyes:     Extraocular Movements: Extraocular movements intact.     Conjunctiva/sclera: Conjunctivae normal.  Cardiovascular:     Rate and Rhythm: Normal rate.  Pulmonary:     Effort: Pulmonary effort is normal.     Breath sounds: Normal breath sounds.  Abdominal:     General: Abdomen is flat.     Palpations: Abdomen is soft.     Tenderness: There is no abdominal tenderness.  Musculoskeletal:        General: No swelling. Normal range of motion.     Cervical back: Neck supple.  Skin:    General: Skin is  warm and dry.  Neurological:     General: No focal deficit present.     Mental Status: He is alert.  Psychiatric:        Mood and Affect: Mood normal.     ED Results / Procedures / Treatments   EKG None  Procedures Procedures  Medications Ordered in the ED Medications - No data to display  Initial Impression and Plan  Patient here with L temporal headache, improved from onset, but not completely resolved and some tenderness concerning for GCA. Will check labs including sed rate and CT head.   ED Course   Clinical Course as of 01/11/23 2056  Wynelle Link Jan 11, 2023  1941 CBC is normal.  [CS]  1958 I personally viewed the images from radiology studies and agree with radiologist interpretation:  CT is negative [CS]  2009 BMP is unremarkable.  [CS]  2055 Sed rate is normal. No concern for GCA. Patient reports headache has resolved. Recommend he take APAP/Motrin if headache returns. PCP follow up, RTED for any other concerns.   [CS]    Clinical Course User Index [CS] Pollyann Savoy, MD     MDM Rules/Calculators/A&P Medical Decision Making Problems Addressed: Acute nonintractable headache, unspecified headache type: acute illness  or injury  Amount and/or Complexity of Data Reviewed Labs: ordered. Decision-making details documented in ED Course. Radiology: ordered and independent interpretation performed. Decision-making details documented in ED Course.  Risk OTC drugs.     Final Clinical Impression(s) / ED Diagnoses Final diagnoses:  Acute nonintractable headache, unspecified headache type    Rx / DC Orders ED Discharge Orders     None        Pollyann Savoy, MD 01/11/23 2056

## 2023-02-12 ENCOUNTER — Other Ambulatory Visit: Payer: Self-pay

## 2023-02-12 ENCOUNTER — Encounter (HOSPITAL_BASED_OUTPATIENT_CLINIC_OR_DEPARTMENT_OTHER): Payer: Self-pay | Admitting: Emergency Medicine

## 2023-02-12 ENCOUNTER — Emergency Department (HOSPITAL_BASED_OUTPATIENT_CLINIC_OR_DEPARTMENT_OTHER)
Admission: EM | Admit: 2023-02-12 | Discharge: 2023-02-12 | Disposition: A | Discharge status: Home / Self Care | Payer: BLUE CROSS/BLUE SHIELD | Attending: Emergency Medicine | Admitting: Emergency Medicine

## 2023-02-12 DIAGNOSIS — R2232 Localized swelling, mass and lump, left upper limb: Secondary | ICD-10-CM | POA: Diagnosis present

## 2023-02-12 DIAGNOSIS — M7989 Other specified soft tissue disorders: Secondary | ICD-10-CM | POA: Diagnosis not present

## 2023-02-12 NOTE — Discharge Instructions (Signed)
Please keep this monitoring, if experience any worsening symptoms, or additional symptoms you may return to the emergency department.  Otherwise please follow-up with your primary care physician as needed.

## 2023-02-12 NOTE — ED Notes (Signed)
Discharge paperwork given and verbally understood. 

## 2023-02-12 NOTE — ED Triage Notes (Signed)
Lump, swelling and pain under left arm. Noticed today

## 2023-02-12 NOTE — ED Provider Notes (Signed)
[name] EMERGENCY DEPARTMENT AT [facility] Provider Note   CSN: [id number] Arrival date & time: 02/12/23  1452     History  No chief complaint on file.   Francisco Lewis is a 52 y.o. male.  52 y.o male with no PMH presents to the ED with a chief complaint of lump under his left arm which he noticed prior to arrival in the ED. Reports no pain to the area but some discomfort with palpation. He reports getting out of a chair when he saw this present. No alleviating factors, he has not taken any medication for improvement. He denies any strain, no chest pain, no shortness of breath or other complaints.   The history is provided by the patient.       Home Medications Prior to Admission medications   Medication Sig Start Date End Date Taking? Authorizing Provider  Buprenorphine HCl-Naloxone HCl 12-3 MG FILM Place 1 Film under the tongue 2 (two) times daily. 09/29/22   [provider]      Allergies    Apple juice and Shellfish allergy    Review of Systems   Review of Systems  Constitutional:  Negative for chills and fever.  Respiratory:  Negative for shortness of breath.     Physical Exam Updated Vital Signs BP 128/80 (BP Location: Right Arm)   Pulse 73   Temp 98.2 F (36.8 C) (Oral)   Resp 16   SpO2 98%  Physical Exam Vitals and nursing note reviewed.  Constitutional:      Appearance: Normal appearance.  HENT:     Head: Normocephalic and atraumatic.     Mouth/Throat:     Mouth: Mucous membranes are moist.  Eyes:     Pupils: Pupils are equal, round, and reactive to light.  Cardiovascular:     Rate and Rhythm: Normal rate.  Pulmonary:     Effort: Pulmonary effort is normal.  Abdominal:     General: Abdomen is flat.  Musculoskeletal:     Cervical back: Normal range of motion and neck supple.     Comments: Left armpit with no swelling, no streaking in the skin, no warmth, no pulsatile mass. 2+ radial pulse.   Skin:    General: Skin is warm  and dry.  Neurological:     Mental Status: He is alert and oriented to person, place, and time.     ED Results / Procedures / Treatments   Labs (all labs ordered are listed, but only abnormal results are displayed) Labs Reviewed - No data to display  EKG None  Radiology No results found.  Procedures Procedures    Medications Ordered in ED Medications - No data to display  ED Course/ Medical Decision Making/ A&P                                 Medical Decision Making   Patient presents to the ED with a chief complaint of swelling under his left armpit, reports he was getting up from a chair when he noticed a swelling string from under his left armpit.  This is mobile, hard and, does not have a pulse.  He is concerned that this was likely an artery, I do not feel that this is arterial in nature as it is very superficial.  I do not feel that this is venous either as there is no bounding pulse to it.  There is no warmth, no  fluctuance, no streaking in the skin to suggest infection versus abscess.  I did ask patient if he did any type of straining, he reports he did not, there is no pain with palpation but noticed some discomfort with movement of it.  I do feel this is likely muscular likely an axillary tendon.  He has full range of motion of his left arm, no other complaints, no chest pain, no shortness of breath.  I do feel that he is appropriate for outpatient treatment.  Patient is hemodynamically stable for discharge.  Portions of this note were generated with Scientist, clinical (histocompatibility and immunogenetics). Dictation errors may occur despite best attempts at proofreading.   Final Clinical Impression(s) / ED Diagnoses Final diagnoses:  Swelling in left armpit    Rx / DC Orders ED Discharge Orders     None         Claude Manges, PA-C 02/12/23 1554    Terrilee Files, MD 02/13/23 1012

## 2023-09-22 ENCOUNTER — Emergency Department (HOSPITAL_COMMUNITY)
Admission: EM | Admit: 2023-09-22 | Discharge: 2023-09-22 | Disposition: A | Discharge status: Home / Self Care | Attending: Emergency Medicine | Admitting: Emergency Medicine

## 2023-09-22 ENCOUNTER — Other Ambulatory Visit: Payer: Self-pay

## 2023-09-22 ENCOUNTER — Encounter (HOSPITAL_COMMUNITY): Payer: Self-pay

## 2023-09-22 DIAGNOSIS — F1510 Other stimulant abuse, uncomplicated: Secondary | ICD-10-CM | POA: Insufficient documentation

## 2023-09-22 DIAGNOSIS — M79662 Pain in left lower leg: Secondary | ICD-10-CM | POA: Diagnosis present

## 2023-09-22 DIAGNOSIS — R600 Localized edema: Secondary | ICD-10-CM | POA: Diagnosis not present

## 2023-09-22 DIAGNOSIS — M79661 Pain in right lower leg: Secondary | ICD-10-CM | POA: Diagnosis not present

## 2023-09-22 DIAGNOSIS — M79604 Pain in right leg: Secondary | ICD-10-CM

## 2023-09-22 LAB — CBC WITH DIFFERENTIAL/PLATELET
Abs Immature Granulocytes: 0.01 10*3/uL (ref 0.00–0.07)
Basophils Absolute: 0 10*3/uL (ref 0.0–0.1)
Basophils Relative: 1 %
Eosinophils Absolute: 0.2 10*3/uL (ref 0.0–0.5)
Eosinophils Relative: 2 %
HCT: 42.9 % (ref 39.0–52.0)
Hemoglobin: 14 g/dL (ref 13.0–17.0)
Immature Granulocytes: 0 %
Lymphocytes Relative: 23 %
Lymphs Abs: 1.6 10*3/uL (ref 0.7–4.0)
MCH: 31.6 pg (ref 26.0–34.0)
MCHC: 32.6 g/dL (ref 30.0–36.0)
MCV: 96.8 fL (ref 80.0–100.0)
Monocytes Absolute: 0.5 10*3/uL (ref 0.1–1.0)
Monocytes Relative: 7 %
Neutro Abs: 4.7 10*3/uL (ref 1.7–7.7)
Neutrophils Relative %: 67 %
Platelets: 238 10*3/uL (ref 150–400)
RBC: 4.43 MIL/uL (ref 4.22–5.81)
RDW: 12.1 % (ref 11.5–15.5)
WBC: 7 10*3/uL (ref 4.0–10.5)
nRBC: 0 % (ref 0.0–0.2)

## 2023-09-22 LAB — BASIC METABOLIC PANEL WITH GFR
Anion gap: 9 (ref 5–15)
BUN: 13 mg/dL (ref 6–20)
CO2: 26 mmol/L (ref 22–32)
Calcium: 9 mg/dL (ref 8.9–10.3)
Chloride: 104 mmol/L (ref 98–111)
Creatinine, Ser: 1.03 mg/dL (ref 0.61–1.24)
GFR, Estimated: >60 mL/min (ref >60)
Glucose, Bld: 107 mg/dL — ABNORMAL HIGH (ref 70–99)
Potassium: 4.5 mmol/L (ref 3.5–5.1)
Sodium: 139 mmol/L (ref 135–145)

## 2023-09-22 LAB — TROPONIN I (HIGH SENSITIVITY): Troponin I (High Sensitivity): 3 ng/L (ref <18)

## 2023-09-22 LAB — RAPID URINE DRUG SCREEN, HOSP PERFORMED
Amphetamines: POSITIVE — AB
Barbiturates: NOT DETECTED
Benzodiazepines: NOT DETECTED
Cocaine: NOT DETECTED
Opiates: NOT DETECTED
Tetrahydrocannabinol: NOT DETECTED

## 2023-09-22 LAB — MAGNESIUM: Magnesium: 2.1 mg/dL (ref 1.7–2.4)

## 2023-09-22 MED ORDER — ENOXAPARIN SODIUM 150 MG/ML IJ SOSY
150.0000 mg | PREFILLED_SYRINGE | INTRAMUSCULAR | Status: AC
  Administered 2023-09-22: 150 mg via SUBCUTANEOUS
  Filled 2023-09-22: qty 1

## 2023-09-22 NOTE — ED Provider Notes (Signed)
 [place] EMERGENCY DEPARTMENT AT [facility] Provider Note   CSN: [id number] Arrival date & time: 09/22/23  1717     History  Chief Complaint  Patient presents with   Leg Pain    Francisco Lewis is a 53 y.o. male with a history of DVT, polysubstance use on Suboxone, presented to the ED with complaint of sharp pains all over his body.  Patient reports about 3 days ago began having pain mostly in his right leg, feels that it is swollen and hurting all over.  His left calf is also "feels like it was burning".  He denies any injuries to his legs.  He says he is also noticed for the past 2 days that he has had sudden sharp pains in different parts of his body, including his hips and his shoulders, lasting 1 or 2 seconds and going away.  He denies fevers, chills, coughing, congestion, diarrhea.  He reports only medicine he takes his Suboxone.  He does not have a PCP.  From reviewing his medical chart it does appear that he had a large proximal left-sided DVT in 2022 on ultrasound.  He was briefly on anticoagulation but reports he has not been on that for some time or years.  He does not have a doctor to refill it.  He denies any recent prolonged immobilization or travel, or recent surgery.  He denies persistent chest pain or difficulty breathing.  HPI     Home Medications Prior to Admission medications   Medication Sig Start Date End Date Taking? Authorizing Provider  Buprenorphine HCl-Naloxone HCl 12-3 MG FILM Place 1 Film under the tongue 2 (two) times daily. 09/29/22   [provider]      Allergies    Apple juice and Shellfish allergy    Review of Systems   Review of Systems  Physical Exam Updated Vital Signs BP 138/86   Pulse (!) 57   Temp 98.3 F (36.8 C) (Oral)   Resp 10   Ht 5\' 11"  (1.803 m)   Wt 96.6 kg   SpO2 99%   BMI 29.71 kg/m  Physical Exam Constitutional:      General: He is not in acute distress. HENT:     Head: Normocephalic and  atraumatic.  Eyes:     Conjunctiva/sclera: Conjunctivae normal.     Pupils: Pupils are equal, round, and reactive to light.  Cardiovascular:     Rate and Rhythm: Normal rate and regular rhythm.  Pulmonary:     Effort: Pulmonary effort is normal. No respiratory distress.  Abdominal:     General: There is no distension.     Tenderness: There is no abdominal tenderness.  Musculoskeletal:     Right lower leg: Edema present.     Left lower leg: Edema present.  Skin:    General: Skin is warm and dry.  Neurological:     General: No focal deficit present.     Mental Status: He is alert. Mental status is at baseline.  Psychiatric:        Mood and Affect: Mood normal.        Behavior: Behavior normal.     ED Results / Procedures / Treatments   Labs (all labs ordered are listed, but only abnormal results are displayed) Labs Reviewed  BASIC METABOLIC PANEL WITH GFR - Abnormal; Notable for the following components:      Result Value   Glucose, Bld 107 (*)    All other components within normal  limits  RAPID URINE DRUG SCREEN, HOSP PERFORMED - Abnormal; Notable for the following components:   Amphetamines POSITIVE (*)    All other components within normal limits  CBC WITH DIFFERENTIAL/PLATELET  MAGNESIUM  TROPONIN I (HIGH SENSITIVITY)    EKG EKG Interpretation Date/Time:  Tuesday Sep 22 2023 19:15:06 EDT Ventricular Rate:  64 PR Interval:  169 QRS Duration:  93 QT Interval:  377 QTC Calculation: 389 R Axis:   1  Text Interpretation: Sinus rhythm Confirmed by Jerald Molly [phone]) on 09/22/2023 7:20:11 PM  Radiology No results found.  Procedures Procedures    Medications Ordered in ED Medications  enoxaparin (LOVENOX) injection 150 mg (150 mg Subcutaneous Given 09/22/23 2057)    ED Course/ Medical Decision Making/ A&P Clinical Course as of 09/22/23 2357  Tue Sep 22, 2023  1856 I am told vascular ultrasound is not available this evening - pt will need to return  tomorrow for DVT imaging [MT]    Clinical Course User Index [MT] Shannara Winbush, Janalyn Me, MD                                 Medical Decision Making Amount and/or Complexity of Data Reviewed Labs: ordered. ECG/medicine tests: ordered.  Risk Prescription drug management.   This patient presents to the ED with concern for leg pain and swelling, intermittent sharp pains over the body. This involves an extensive number of treatment options, and is a complaint that carries with it a high risk of complications and morbidity.  The differential diagnosis includes DVT versus metabolic derangement versus anemia versus other  Co-morbidities that complicate the patient evaluation: History of prior DVT, potentially unprovoked, at risk of recurrence  External records from outside source obtained and reviewed including DVT ultrasound 2020  I ordered and personally interpreted labs.  The pertinent results include: No emergent findings  I ordered medication including Lovenox for prophylaxis for DVT  I have reviewed the patients home medicines and have made adjustments as needed  Test Considered: Low suspicion for acute PE.  He has no tachycardia or hypoxia or persistent chest pain or discomfort  After the interventions noted above, I reevaluated the patient and found that they have: stayed the same   Disposition:  After consideration of the diagnostic results and the patients response to treatment, I feel that the patent would benefit from close outpatient follow-up.  Patient was advised he will need to return to the hospital tomorrow morning with instructions provided for his vascular ultrasound for DVT rule out.         Final Clinical Impression(s) / ED Diagnoses Final diagnoses:  Pain in both lower extremities    Rx / DC Orders ED Discharge Orders          Ordered    LE Venous       Comments: IMPORTANT PATIENT INSTRUCTIONS:  You have been scheduled for an Outpatient Vascular Study  at [facility].    If tomorrow is a Saturday, Sunday or holiday, please go to the [facility] Emergency Department Registration Desk at 11 am tomorrow morning and tell them you are there for a vascular study.   If tomorrow is a weekday (Monday-Friday), please go to [facility] Entrance [facility] Registration at 11 am and tell them you are there for a vascular study.   09/22/23 1942  Arvilla Birmingham, MD 09/22/23 [phone]

## 2023-09-22 NOTE — Discharge Instructions (Addendum)
IMPORTANT PATIENT INSTRUCTIONS:  You have been scheduled for an Outpatient Vascular Study at [facility].    If tomorrow is a Saturday, Sunday or holiday, please go to the [place] Emergency Department Registration Desk at 11 am tomorrow morning and tell them you are there for a vascular study.   If tomorrow is a weekday (Monday-Friday), please go to  [facility]ospital En[facility] Registration at 11 am and tell them you are there for a vascular study. 

## 2023-09-22 NOTE — ED Triage Notes (Signed)
 Bilateral lower leg pain, right leg is worse than the left. Pt states he gets random pains in his body, this has been happening over the last 3 days.

## 2023-09-22 NOTE — ED Notes (Signed)
 Urinal left at bedside, pt. aware urine sample is needed

## 2023-09-23 ENCOUNTER — Other Ambulatory Visit (HOSPITAL_COMMUNITY): Payer: Self-pay

## 2023-09-23 ENCOUNTER — Ambulatory Visit (HOSPITAL_COMMUNITY)
Admission: RE | Admit: 2023-09-23 | Discharge: 2023-09-23 | Disposition: A | Discharge status: Home / Self Care | Source: Ambulatory Visit | Attending: Emergency Medicine | Admitting: Emergency Medicine

## 2023-09-23 ENCOUNTER — Ambulatory Visit: Attending: Vascular Surgery | Admitting: Student-PharmD

## 2023-09-23 ENCOUNTER — Encounter: Payer: Self-pay | Admitting: Student-PharmD

## 2023-09-23 ENCOUNTER — Telehealth (HOSPITAL_COMMUNITY): Payer: Self-pay

## 2023-09-23 VITALS — BP 123/79 | HR 63 | Wt 211.7 lb

## 2023-09-23 DIAGNOSIS — M79604 Pain in right leg: Secondary | ICD-10-CM | POA: Insufficient documentation

## 2023-09-23 DIAGNOSIS — M7989 Other specified soft tissue disorders: Secondary | ICD-10-CM | POA: Diagnosis not present

## 2023-09-23 DIAGNOSIS — I82451 Acute embolism and thrombosis of right peroneal vein: Secondary | ICD-10-CM | POA: Insufficient documentation

## 2023-09-23 DIAGNOSIS — M79605 Pain in left leg: Secondary | ICD-10-CM | POA: Insufficient documentation

## 2023-09-23 DIAGNOSIS — Z86718 Personal history of other venous thrombosis and embolism: Secondary | ICD-10-CM | POA: Diagnosis present

## 2023-09-23 MED ORDER — XARELTO VTE STARTER PACK 15 & 20 MG PO TBPK
ORAL_TABLET | ORAL | 0 refills | Status: DC
Start: 2023-09-23 — End: 2023-09-25
  Filled 2023-09-23: qty 51, 28d supply, fill #0

## 2023-09-23 NOTE — Telephone Encounter (Signed)
 Attempted to contact the patient to schedule VAS [place]. No answer. Left message. First Attempt Provided direct contact number for scheduling: [phone].

## 2023-09-23 NOTE — Patient Instructions (Signed)
-  Start rivaroxaban (Xarelto) 15 mg twice daily with food for 21 days followed by 20 mg daily with food. -We will arrange for your refills at your next visit once your insurance is active again.  -It is important to take your medication around the same time every day.  -Avoid NSAIDs like ibuprofen  (Advil , Motrin ) and naproxen (Aleve) as well as aspirin doses over 100 mg daily. -Tylenol  (acetaminophen ) is the preferred over the counter pain medication to lower the risk of bleeding. -Be sure to alert all of your health care providers that you are taking an anticoagulant prior to starting a new medication or having a procedure. -Monitor for signs and symptoms of bleeding (abnormal bruising, prolonged bleeding, nose bleeds, bleeding from gums, discolored urine, black tarry stools). If you have fallen and hit your head OR if your bleeding is severe or not stopping, seek emergency care.  -Go to the emergency room if emergent signs and symptoms of new clot occur (new or worse swelling and pain in an arm or leg, shortness of breath, chest pain, fast or irregular heartbeats, lightheadedness, dizziness, fainting, coughing up blood) or if you experience a significant color change (pale or blue) in the extremity that has the DVT.  -We recommend you wear compression stockings (20-30 mmHg) as long as you are having swelling or pain. Be sure to purchase the correct size and take them off at night.   If you have any questions or need to reschedule an appointment, please call [phone]. If you are having an emergency, call 911 or present to the nearest emergency room.   What is a DVT?  -Deep vein thrombosis (DVT) is a condition in which a blood clot forms in a vein of the deep venous system which can occur in the lower leg, thigh, pelvis, arm, or neck. This condition is serious and can be life-threatening if the clot travels to the arteries of the lungs and causing a blockage (pulmonary embolism, PE). A DVT can also  damage veins in the leg, which can lead to long-term venous disease, leg pain, swelling, discoloration, and ulcers or sores (post-thrombotic syndrome).  -Treatment may include taking an anticoagulant medication to prevent more clots from forming and the current clot from growing, wearing compression stockings, and/or surgical procedures to remove or dissolve the clot.
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
## 2023-09-24 ENCOUNTER — Emergency Department (HOSPITAL_COMMUNITY)
Admission: EM | Admit: 2023-09-24 | Discharge: 2023-09-25 | Disposition: A | Discharge status: Home / Self Care | Attending: Emergency Medicine | Admitting: Emergency Medicine

## 2023-09-24 ENCOUNTER — Other Ambulatory Visit: Payer: Self-pay

## 2023-09-24 ENCOUNTER — Encounter (HOSPITAL_COMMUNITY): Payer: Self-pay

## 2023-09-24 ENCOUNTER — Emergency Department (HOSPITAL_COMMUNITY)

## 2023-09-24 DIAGNOSIS — I82461 Acute embolism and thrombosis of right calf muscular vein: Secondary | ICD-10-CM | POA: Diagnosis not present

## 2023-09-24 DIAGNOSIS — M7989 Other specified soft tissue disorders: Secondary | ICD-10-CM | POA: Diagnosis present

## 2023-09-24 DIAGNOSIS — R0789 Other chest pain: Secondary | ICD-10-CM | POA: Diagnosis not present

## 2023-09-24 DIAGNOSIS — R519 Headache, unspecified: Secondary | ICD-10-CM | POA: Diagnosis not present

## 2023-09-24 DIAGNOSIS — Z7901 Long term (current) use of anticoagulants: Secondary | ICD-10-CM | POA: Diagnosis not present

## 2023-09-24 LAB — CBC
HCT: 42.3 % (ref 39.0–52.0)
Hemoglobin: 14.1 g/dL (ref 13.0–17.0)
MCH: 31.8 pg (ref 26.0–34.0)
MCHC: 33.3 g/dL (ref 30.0–36.0)
MCV: 95.5 fL (ref 80.0–100.0)
Platelets: 252 10*3/uL (ref 150–400)
RBC: 4.43 MIL/uL (ref 4.22–5.81)
RDW: 12.1 % (ref 11.5–15.5)
WBC: 7 10*3/uL (ref 4.0–10.5)
nRBC: 0 % (ref 0.0–0.2)

## 2023-09-24 LAB — BASIC METABOLIC PANEL WITH GFR
Anion gap: 9 (ref 5–15)
BUN: 13 mg/dL (ref 6–20)
CO2: 25 mmol/L (ref 22–32)
Calcium: 9.1 mg/dL (ref 8.9–10.3)
Chloride: 106 mmol/L (ref 98–111)
Creatinine, Ser: 0.88 mg/dL (ref 0.61–1.24)
GFR, Estimated: >60 mL/min (ref >60)
Glucose, Bld: 93 mg/dL (ref 70–99)
Potassium: 4.3 mmol/L (ref 3.5–5.1)
Sodium: 140 mmol/L (ref 135–145)

## 2023-09-24 LAB — TROPONIN I (HIGH SENSITIVITY): Troponin I (High Sensitivity): 4 ng/L (ref <18)

## 2023-09-24 MED ORDER — IOHEXOL 350 MG/ML SOLN
75.0000 mL | Freq: Once | INTRAVENOUS | Status: AC | PRN
  Administered 2023-09-24: 75 mL via INTRAVENOUS

## 2023-09-24 NOTE — ED Notes (Signed)
 Pt not answering to call for second trop blood draw

## 2023-09-24 NOTE — ED Triage Notes (Signed)
 Dx with DVT yesterday at [facility]. Started on Xarelto yesterday.   Today says he began experiencing chest pains, pressure and fatigue when walking.

## 2023-09-24 NOTE — ED Notes (Signed)
 Nurse starting IV and will get labs.  KM

## 2023-09-24 NOTE — ED Provider Triage Note (Signed)
 Emergency Medicine Provider Triage Evaluation Note  Emon Faciane , a 53 y.o. male  was evaluated in triage.  Pt complains of chest pain that started today.  Recently diagnosed with DVT and started on Xarelto.  Only took 1 dose so far.  Hemodynamically stable.  Review of Systems  Positive: As above Negative: As above  Physical Exam  BP 122/82 (BP Location: Left Arm)   Pulse 68   Temp 97.8 F (36.6 C)   Resp 16   SpO2 100%  Gen:   Awake, no distress   Resp:  Normal effort  MSK:   Moves extremities without difficulty  Other:    Medical Decision Making  Medically screening exam initiated at 9:31 PM.  Appropriate orders placed.  Damond Perpich was informed that the remainder of the evaluation will be completed by another provider, this initial triage assessment does not replace that evaluation, and the importance of remaining in the ED until their evaluation is complete.     Lucina Sabal, PA-C 09/24/23 2132

## 2023-09-25 LAB — TROPONIN I (HIGH SENSITIVITY): Troponin I (High Sensitivity): 3 ng/L (ref <18)

## 2023-09-25 MED ORDER — APIXABAN (ELIQUIS) VTE STARTER PACK (10MG AND 5MG): ORAL_TABLET | ORAL | 0 refills | Status: DC

## 2023-09-25 NOTE — ED Notes (Signed)
 No response 45+ mins

## 2023-09-25 NOTE — ED Notes (Signed)
 Pt said he was outside -still has iv access

## 2023-09-25 NOTE — ED Notes (Signed)
 Pt reports taking Xarelto  last night at 9pm in ED lobby and reported having a headache after.

## 2023-09-25 NOTE — Discharge Instructions (Signed)
 He was in the ER today with reassuring workup.  You do not have a blood clot in your lungs.  You have been transitioned to Eliquis  from Xarelto .  Please pick it up from pharmacy this morning and take your first dose.  Follow-up with your primary care doctor in 1 week for further discussion of ongoing management of your DVT.  Return to the ER with any new severe symptoms.  DO NOT TAKE any more Xarelto .

## 2023-09-25 NOTE — ED Provider Notes (Signed)
 [place] EMERGENCY DEPARTMENT AT [facility] Provider Note   CSN: [id number] Arrival date & time: 09/24/23  2040     History  Chief Complaint  Patient presents with   Chest Pain    Ji Fila is a 53 y.o. 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  No history of same.  Patient states he was on Eliquis  in the past for DVT in his left leg and tolerates Eliquis  just fine would prefer to switch to Eliquis .  No chest pain at this time, No shortness of breath, no syncope.  HPI     Home Medications Prior to Admission medications   Medication Sig Start Date End Date Taking? Authorizing Provider  APIXABAN  (ELIQUIS ) VTE STARTER PACK (10MG  AND 5MG ) Take as directed on package: start with two-5mg  tablets twice daily for 7 days. On day 8, switch to one-5mg  tablet twice daily. 09/25/23  Yes Donzella Carrol R, PA-C  ascorbic acid (VITAMIN C) 500 MG tablet Take 500 mg by mouth daily.    [provider]  Buprenorphine HCl-Naloxone HCl 12-3 MG FILM Place 1 Film under the tongue 2 (two) times daily. 09/29/22   [provider]  cholecalciferol (VITAMIN D3) 25 MCG (1000 UNIT) tablet Take 1,000 Units by mouth daily.    [provider]  sildenafil (VIAGRA) 100 MG tablet Take 100 mg by mouth daily as needed. 07/02/23   [provider]  vitamin B-12 (CYANOCOBALAMIN) 100 MCG tablet Take 100 mcg by mouth daily.    [provider]      Allergies    Apple juice and Shellfish allergy    Review of Systems   Review of Systems  Respiratory:  Positive for chest tightness.   Neurological:  Positive for headaches.    Physical Exam Updated Vital Signs BP 129/87   Pulse 61   Temp 97.8 F (36.6 C) (Oral)   Resp 18   SpO2 100%  Physical Exam Vitals and nursing note reviewed.  Constitutional:      Appearance: He is not  ill-appearing or toxic-appearing.  HENT:     Head: Normocephalic and atraumatic.     Mouth/Throat:     Mouth: Mucous membranes are moist.     Pharynx: No oropharyngeal exudate or posterior oropharyngeal erythema.  Eyes:     General:        Right eye: No discharge.        Left eye: No discharge.     Conjunctiva/sclera: Conjunctivae normal.  Cardiovascular:     Rate and Rhythm: Normal rate and regular rhythm.     Pulses: Normal pulses.     Heart sounds: Normal heart sounds. No murmur heard.    Comments: R calf TTP and mild enlargement compared to L Pulmonary:     Effort: Pulmonary effort is normal. No respiratory distress.     Breath sounds: Normal breath sounds. No wheezing or rales.  Chest:     Chest wall: No mass, tenderness or edema.  Abdominal:     General: Bowel sounds are normal. There is no distension.     Palpations: Abdomen is soft.     Tenderness: There is no abdominal tenderness.  Musculoskeletal:        General: No deformity.     Cervical back: Neck supple.     Right lower leg: Tenderness present. Edema present.  Left lower leg: No edema.  Skin:    General: Skin is warm and dry.     Capillary Refill: Capillary refill takes less than 2 seconds.  Neurological:     General: No focal deficit present.     Mental Status: He is alert and oriented to person, place, and time. Mental status is at baseline.     GCS: GCS eye subscore is 4. GCS verbal subscore is 5. GCS motor subscore is 6.     Sensory: Sensation is intact.     Motor: Motor function is intact.     Coordination: Coordination is intact.  Psychiatric:        Mood and Affect: Mood normal.     ED Results / Procedures / Treatments   Labs (all labs ordered are listed, but only abnormal results are displayed) Labs Reviewed  BASIC METABOLIC PANEL WITH GFR  CBC  TROPONIN I (HIGH SENSITIVITY)  TROPONIN I (HIGH SENSITIVITY)    EKG None  Radiology CT Angio Chest PE W and/or Wo Contrast Result Date:  09/24/2023 CLINICAL DATA:  Chest pain EXAM: CT ANGIOGRAPHY CHEST WITH CONTRAST TECHNIQUE: Multidetector CT imaging of the chest was performed using the standard protocol during bolus administration of intravenous contrast. Multiplanar CT image reconstructions and MIPs were obtained to evaluate the vascular anatomy. RADIATION DOSE REDUCTION: This exam was performed according to the departmental dose-optimization program which includes automated exposure control, adjustment of the mA and/or kV according to patient size and/or use of iterative reconstruction technique. CONTRAST:  75mL OMNIPAQUE  IOHEXOL  350 MG/ML SOLN COMPARISON:  None Available. FINDINGS: Cardiovascular: No filling defects in the pulmonary arteries to suggest pulmonary emboli. Heart is normal size. Aorta is normal caliber. Scattered calcifications in the left anterior descending coronary artery. Mediastinum/Nodes: No mediastinal, hilar, or axillary adenopathy. Trachea and esophagus are unremarkable. Thyroid unremarkable. Lungs/Pleura: No confluent opacities or effusions. Upper Abdomen: No acute findings Musculoskeletal: Chest wall soft tissues are unremarkable. No acute bony abnormality. Review of the MIP images confirms the above findings. IMPRESSION: No evidence of pulmonary embolus. Left anterior descending calcifications No acute cardiopulmonary disease. Electronically Signed   By: Janeece Mechanic M.D.   On: 09/24/2023 23:36   DG Chest 2 View Result Date: 09/24/2023 CLINICAL DATA:  Chest pain EXAM: CHEST - 2 VIEW COMPARISON:  12/06/2021 FINDINGS: The heart size and mediastinal contours are within normal limits. Both lungs are clear. The visualized skeletal structures are unremarkable. IMPRESSION: No active cardiopulmonary disease. Electronically Signed   By: Melven Stable.  Shick M.D.   On: 09/24/2023 21:39   LE Venous Result Date: 09/23/2023  Lower Venous DVT Study Patient Name:  Ryyan Aredondo  Date of Exam:   09/23/2023 Medical Rec #: [id number]        Accession #:    [id number] Date of Birth: 09-Aug-1970       Patient Gender: M Patient Age:   82 years Exam Location:  [address] Procedure:      VAS US  LOWER EXTREMITY VENOUS (DVT) Referring Phys: Zoila Hines TRIFAN --------------------------------------------------------------------------------  Indications: ED appt to evaluate for bilateral lower extremity due to leg pain.  Risk Factors: DVT History of left leg DVT 04/11/2019. Performing Technologist: Parke Boll RVS, RCS  Examination Guidelines: A complete evaluation includes B-mode imaging, spectral Doppler, color Doppler, and power Doppler as needed of all accessible portions of each vessel. Bilateral testing is considered an integral part of a complete examination. Limited examinations for reoccurring indications may be performed as noted. The reflux portion of the  exam is performed with the patient in reverse Trendelenburg.  +---------+---------------+---------+-----------+----------+--------------+ RIGHT    CompressibilityPhasicitySpontaneityPropertiesThrombus Aging +---------+---------------+---------+-----------+----------+--------------+ CFV      Full                                                        +---------+---------------+---------+-----------+----------+--------------+ SFJ      Full                                                        +---------+---------------+---------+-----------+----------+--------------+ FV Prox  Full                                                        +---------+---------------+---------+-----------+----------+--------------+ FV Mid   Full                                                        +---------+---------------+---------+-----------+----------+--------------+ FV DistalFull                                                        +---------+---------------+---------+-----------+----------+--------------+ POP      Full                                                         +---------+---------------+---------+-----------+----------+--------------+ PTV      Full                                                        +---------+---------------+---------+-----------+----------+--------------+ PERO     Partial                                                     +---------+---------------+---------+-----------+----------+--------------+ GSV      Full                                                        +---------+---------------+---------+-----------+----------+--------------+   +---------+---------------+---------+-----------+----------+--------------+ LEFT     CompressibilityPhasicitySpontaneityPropertiesThrombus Aging +---------+---------------+---------+-----------+----------+--------------+ CFV      Full                                                        +---------+---------------+---------+-----------+----------+--------------+  SFJ      Full                                                        +---------+---------------+---------+-----------+----------+--------------+ FV Prox  Full                                                        +---------+---------------+---------+-----------+----------+--------------+ FV Mid   Full                                                        +---------+---------------+---------+-----------+----------+--------------+ FV DistalFull                                                        +---------+---------------+---------+-----------+----------+--------------+ POP      Full                                                        +---------+---------------+---------+-----------+----------+--------------+ PTV      Full                                                        +---------+---------------+---------+-----------+----------+--------------+ PERO     Full                                                         +---------+---------------+---------+-----------+----------+--------------+ GSV      Full                                                        +---------+---------------+---------+-----------+----------+--------------+    Findings reported to DVT clinic per order instructions. Appt created for DVT clinic today at 3:00.  Summary: RIGHT: - Findings consistent with acute deep vein thrombosis involving one right peroneal vein from the mid to distal calf. The proximal calf is poorly visualized.  LEFT: - There is no evidence of deep vein thrombosis in the lower extremity.  *See table(s) above for measurements and observations. Electronically signed by Angela Kell MD on 09/23/2023 at 4:46:27 PM.    Final     Procedures Procedures    Medications Ordered in ED Medications  iohexol  (OMNIPAQUE ) 350 MG/ML injection 75  mL (75 mLs Intravenous Contrast Given 09/24/23 2320)    ED Course/ Medical Decision Making/ A&P Clinical Course as of 09/25/23 5409  Fri Sep 25, 2023  0525 Case discussed with pharmacist Marijean Shouts, who agrees with plan to transition patient to Eliquis . Should initiate 10 mg BID x 7 days, followed by 5 mg BID moving forward. I appreciate his collaboration in the care of this patient.  [RS]    Clinical Course User Index [RS] Meredyth Hornung, Adelle Agent, PA-C                                 Medical Decision Making 53 year old male presents with concern for chest pressure and headaches after Xarelto  dosages.  Normal vitals resting in the emergency department.  Cardiopulmonary abdominal exams are benign.  Lower extremity exam as above with right lower extremity swelling and tenderness to palpation.  Amount and/or Complexity of Data Reviewed Labs: ordered.    Details: CBC, BMP, troponins are all normal.   Radiology: ordered.    Details: Chest x-ray negative for acute cardiopulmonary disease and PE study is negative for acute PE.   ECG/medicine tests:     Details: EKG with normal sinus rhythm.     Risk Prescription drug management.   Case discussed with the pharmacist above.  No evidence of PE or other emergent underlying medical condition that would warrant further ED workup or inpatient management at this time.  New prescription for Eliquis  given to the patient and patient directed to stop taking the Xarelto .  ED pharmacist also spoke with patient regarding initiation of Eliquis .  Patient well-appearing at this time, normal hemodynamics and safe for discharge.  Mekko voiced understanding of his medical evaluation and treatment plan. Each of their questions answered to their expressed satisfaction.  Return precautions were given.  Patient is well-appearing, stable, and was discharged in good condition.  This chart was dictated using voice recognition software, Dragon. Despite the best efforts of this provider to proofread and correct errors, errors may still occur which can change documentation meaning.         Final Clinical Impression(s) / ED Diagnoses Final diagnoses:  Acute deep vein thrombosis (DVT) of calf muscle vein of right lower extremity (HCC)    Rx / DC Orders ED Discharge Orders          Ordered    APIXABAN  (ELIQUIS ) VTE STARTER PACK (10MG  AND 5MG )       Note to Pharmacy: If starter pack unavailable, substitute with seventy-four 5 mg apixaban  tabs following the above SIG directions.   09/25/23 0536              Trajon Rosete, Adelle Agent, PA-C 09/25/23 8119    Ballard Bongo, MD 09/25/23 [phone]

## 2023-10-19 ENCOUNTER — Encounter: Payer: Self-pay | Admitting: Student-PharmD

## 2023-10-19 ENCOUNTER — Ambulatory Visit: Attending: Surgery | Admitting: Student-PharmD

## 2023-10-19 ENCOUNTER — Other Ambulatory Visit (HOSPITAL_COMMUNITY): Payer: Self-pay

## 2023-10-19 VITALS — BP 112/70 | HR 66 | Wt 207.3 lb

## 2023-10-19 DIAGNOSIS — I82451 Acute embolism and thrombosis of right peroneal vein: Secondary | ICD-10-CM

## 2023-10-19 MED ORDER — APIXABAN 5 MG PO TABS
5.0000 mg | ORAL_TABLET | Freq: Two times a day (BID) | ORAL | 1 refills | Status: AC
Start: 2023-10-19 — End: ?
  Filled 2023-10-19: qty 180, 90d supply, fill #0

## 2023-10-19 NOTE — Patient Instructions (Signed)
-  Continue apixaban  (Eliquis ) 5 mg twice daily. -Your refills have been sent to [facility].  -It is important to take your medication around the same time every day.  -Avoid NSAIDs like ibuprofen  (Advil , Motrin ) and naproxen (Aleve) as well as aspirin doses over 100 mg daily. -Tylenol  (acetaminophen ) is the preferred over the counter pain medication to lower the risk of bleeding. -Be sure to alert all of your health care providers that you are taking an anticoagulant prior to starting a new medication or having a procedure. -Monitor for signs and symptoms of bleeding (abnormal bruising, prolonged bleeding, nose bleeds, bleeding from gums, discolored urine, black tarry stools). If you have fallen and hit your head OR if your bleeding is severe or not stopping, seek emergency care.  -Go to the emergency room if emergent signs and symptoms of new clot occur (new or worse swelling and pain in an arm or leg, shortness of breath, chest pain, fast or irregular heartbeats, lightheadedness, dizziness, fainting, coughing up blood) or if you experience a significant color change (pale or blue) in the extremity that has the DVT.  -We recommend you wear compression stockings (20-30 mmHg) as long as you are having swelling or pain. Be sure to purchase the correct size and take them off at night.   If you have any questions or need to reschedule an appointment, please call [phone]. If you are having an emergency, call 911 or present to the nearest emergency room.   What is a DVT?  -Deep vein thrombosis (DVT) is a condition in which a blood clot forms in a vein of the deep venous system which can occur in the lower leg, thigh, pelvis, arm, or neck. This condition is serious and can be life-threatening if the clot travels to the arteries of the lungs and causing a blockage (pulmonary embolism, PE). A DVT can also damage veins in the leg, which can lead to long-term venous disease, leg pain,  swelling, discoloration, and ulcers or sores (post-thrombotic syndrome).  -Treatment may include taking an anticoagulant medication to prevent more clots from forming and the current clot from growing, wearing compression stockings, and/or surgical procedures to remove or dissolve the clot.
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
## 2023-10-20 ENCOUNTER — Other Ambulatory Visit (HOSPITAL_COMMUNITY): Payer: Self-pay

## 2023-10-21 ENCOUNTER — Inpatient Hospital Stay

## 2023-10-21 ENCOUNTER — Inpatient Hospital Stay: Attending: Hematology and Oncology | Admitting: Hematology and Oncology

## 2023-10-21 VITALS — BP 125/84 | HR 58 | Temp 98.0°F | Resp 17 | Wt 209.7 lb

## 2023-10-21 DIAGNOSIS — Z800 Family history of malignant neoplasm of digestive organs: Secondary | ICD-10-CM | POA: Insufficient documentation

## 2023-10-21 DIAGNOSIS — I82451 Acute embolism and thrombosis of right peroneal vein: Secondary | ICD-10-CM

## 2023-10-21 DIAGNOSIS — F17210 Nicotine dependence, cigarettes, uncomplicated: Secondary | ICD-10-CM | POA: Diagnosis not present

## 2023-10-21 LAB — CBC WITH DIFFERENTIAL (CANCER CENTER ONLY)
Abs Immature Granulocytes: 0.01 10*3/uL (ref 0.00–0.07)
Basophils Absolute: 0 10*3/uL (ref 0.0–0.1)
Basophils Relative: 1 %
Eosinophils Absolute: 0.3 10*3/uL (ref 0.0–0.5)
Eosinophils Relative: 4 %
HCT: 41.3 % (ref 39.0–52.0)
Hemoglobin: 14.3 g/dL (ref 13.0–17.0)
Immature Granulocytes: 0 %
Lymphocytes Relative: 33 %
Lymphs Abs: 2.1 10*3/uL (ref 0.7–4.0)
MCH: 31.7 pg (ref 26.0–34.0)
MCHC: 34.6 g/dL (ref 30.0–36.0)
MCV: 91.6 fL (ref 80.0–100.0)
Monocytes Absolute: 0.5 10*3/uL (ref 0.1–1.0)
Monocytes Relative: 7 %
Neutro Abs: 3.6 10*3/uL (ref 1.7–7.7)
Neutrophils Relative %: 55 %
Platelet Count: 223 10*3/uL (ref 150–400)
RBC: 4.51 MIL/uL (ref 4.22–5.81)
RDW: 11.9 % (ref 11.5–15.5)
WBC Count: 6.5 10*3/uL (ref 4.0–10.5)
nRBC: 0 % (ref 0.0–0.2)

## 2023-10-21 LAB — CMP (CANCER CENTER ONLY)
ALT: 16 U/L (ref 0–44)
AST: 19 U/L (ref 15–41)
Albumin: 4.5 g/dL (ref 3.5–5.0)
Alkaline Phosphatase: 79 U/L (ref 38–126)
Anion gap: 6 (ref 5–15)
BUN: 17 mg/dL (ref 6–20)
CO2: 29 mmol/L (ref 22–32)
Calcium: 9.4 mg/dL (ref 8.9–10.3)
Chloride: 103 mmol/L (ref 98–111)
Creatinine: 0.9 mg/dL (ref 0.61–1.24)
GFR, Estimated: >60 mL/min (ref >60)
Glucose, Bld: 103 mg/dL — ABNORMAL HIGH (ref 70–99)
Potassium: 4.4 mmol/L (ref 3.5–5.1)
Sodium: 138 mmol/L (ref 135–145)
Total Bilirubin: 0.2 mg/dL (ref 0.0–1.2)
Total Protein: 7.5 g/dL (ref 6.5–8.1)

## 2023-10-21 NOTE — Progress Notes (Signed)
 [facility] [phone]   [phone]  INITIAL CONSULT NOTE  Patient Care Team: Brady Cagey, NP-C as PCP - General (Pediatrics)  Hematological/Oncological History # Right Lower Extremity DVT  09/23/2023: LE US  showed acute deep vein thrombosis involving one right peroneal vein from the mid to distal calf.  09/24/2023: CT Chest angio showed no evidence of PE  CHIEF COMPLAINTS/PURPOSE OF CONSULTATION:  "Right Lower Extremity DVT  "  HISTORY OF PRESENTING ILLNESS:  Francisco Lewis 53 y.o. male with no significant past medical history who presents for evaluation of an unprovoked right lower extremity DVT.  On review of the previous records Francisco Lewis presented to the emergency department on 09/23/2023 with right lower extremity pain.  He underwent an ultrasound which confirmed a right peroneal vein DVT.  Due to concern for these findings the patient was started on Eliquis  therapy and referred to hematology for further evaluation and management.  On exam today Francisco Lewis reports that he did develop a blood clot approximately 5 years ago when he was "doing drugs and doing nothing".  He had been sitting around and unfortunately developed a blood clot.  He was on Eliquis  for approximately 6 to 12 months time for his prior clot.  He notes that this most recent blood clot "came out of the blue".  He reports that the Eliquis  therapy has been improving his symptoms and he is having less swelling and pain in his leg.  He reports that he had no recent long travel, no recent surgeries, or immobility.  There was no clear provoking factor.  On further discussion he reports there is no family history of clots.  His mother had asthma, blood pressure issues, type 2 diabetes.  His brother had a stroke.  He reports his father died of throat cancer.  His mother had a stroke.  He notes that he smokes about 1 pack of cigarettes per day.  He notes that he does not drink.  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  He notes nothing else out of the ordinary recently.  He is tolerating his Eliquis  therapy well with no bleeding, bruising, or dark stools.  He reports the medication cost him $30 for 69-month supply.  Overall he is willing and able to continue with Eliquis  at this time.  A full 10 point ROS is otherwise negative.  MEDICAL HISTORY:  No past medical history on file.  SURGICAL HISTORY: Past Surgical History:  Procedure Laterality Date   ORTHOPEDIC SURGERY     right leg hardware    SOCIAL HISTORY: Social History   Socioeconomic History   Marital status: Single    Spouse name: Not on file   Number of children: Not on file   Years of education: Not on file   Highest education level: Not on file  Occupational History   Not on file  Tobacco Use   Smoking status: Every Day    Current packs/day: 0.50    Types: Cigarettes   Smokeless tobacco: Never  Substance and Sexual Activity   Alcohol use: No   Drug use: No   Sexual activity: Not on file  Other Topics Concern   Not on file  Social History Narrative   Not on file   Social Drivers of Health   Financial Resource Strain: Not on file  Food Insecurity: Not on file  Transportation Needs: Not on file  Physical Activity: Not on file  Stress: Not on file  Social Connections: Not on file  Intimate Partner Violence: Not on file    FAMILY HISTORY: No family history on file.  ALLERGIES:  is allergic to apple juice and shellfish allergy.  MEDICATIONS:  Current Outpatient Medications  Medication Sig Dispense Refill   apixaban  (ELIQUIS ) 5 MG TABS tablet Take 1 tablet (5 mg total) by mouth 2 (two) times daily. 180 tablet 1   ascorbic acid (VITAMIN C) 500 MG tablet Take 500 mg by mouth daily.     Buprenorphine HCl-Naloxone HCl 12-3 MG FILM Place 1 Film under the tongue 2 (two) times daily.     cholecalciferol (VITAMIN D3) 25 MCG (1000 UNIT) tablet Take 1,000 Units by mouth daily.      sildenafil (VIAGRA) 100 MG tablet Take 100 mg by mouth daily as needed.     vitamin B-12 (CYANOCOBALAMIN) 100 MCG tablet Take 100 mcg by mouth daily.     No current facility-administered medications for this visit.    REVIEW OF SYSTEMS:   Constitutional: ( - ) fevers, ( - )  chills , ( - ) night sweats Eyes: ( - ) blurriness of vision, ( - ) double vision, ( - ) watery eyes Ears, nose, mouth, throat, and face: ( - ) mucositis, ( - ) sore throat Respiratory: ( - ) cough, ( - ) dyspnea, ( - ) wheezes Cardiovascular: ( - ) palpitation, ( - ) chest discomfort, ( - ) lower extremity swelling Gastrointestinal:  ( - ) nausea, ( - ) heartburn, ( - ) change in bowel habits Skin: ( - ) abnormal skin rashes Lymphatics: ( - ) new lymphadenopathy, ( - ) easy bruising Neurological: ( - ) numbness, ( - ) tingling, ( - ) new weaknesses Behavioral/Psych: ( - ) mood change, ( - ) new changes  All other systems were reviewed with the patient and are negative.  PHYSICAL EXAMINATION:  Vitals:   10/21/23 1303  BP: 125/84  Pulse: (!) 58  Resp: 17  Temp: 98 F (36.7 C)  SpO2: 100%   Filed Weights   10/21/23 1303  Weight: 209 lb 11.2 oz (95.1 kg)    GENERAL: well appearing middle-aged African-American male in NAD  SKIN: skin color, texture, turgor are normal, no rashes or significant lesions EYES: conjunctiva are pink and non-injected, sclera clear LUNGS: clear to auscultation and percussion with normal breathing effort HEART: regular rate & rhythm and no murmurs and no lower extremity edema Musculoskeletal: no cyanosis of digits and no clubbing  PSYCH: alert & oriented x 3, fluent speech NEURO: no focal motor/sensory deficits  LABORATORY DATA:  I have reviewed the data as listed    Latest Ref Rng & Units 10/21/2023    2:04 PM 09/24/2023    9:26 PM 09/22/2023    5:48 PM  CBC  WBC 4.0 - 10.5 K/uL 6.5  7.0  7.0   Hemoglobin 13.0 - 17.0 g/dL 16.1  09.6  04.5   Hematocrit 39.0 - 52.0 % 41.3   42.3  42.9   Platelets 150 - 400 K/uL 223  252  238        Latest Ref Rng & Units 10/21/2023    2:04 PM 09/24/2023    9:26 PM 09/22/2023    5:48 PM  CMP  Glucose 70 - 99 mg/dL 409  93  811   BUN 6 - 20 mg/dL 17  13  13    Creatinine 0.61 - 1.24 mg/dL 9.14  7.82  9.56   Sodium  135 - 145 mmol/L 138  140  139   Potassium 3.5 - 5.1 mmol/L 4.4  4.3  4.5   Chloride 98 - 111 mmol/L 103  106  104   CO2 22 - 32 mmol/L 29  25  26    Calcium 8.9 - 10.3 mg/dL 9.4  9.1  9.0   Total Protein 6.5 - 8.1 g/dL 7.5     Total Bilirubin 0.0 - 1.2 mg/dL 0.2     Alkaline Phos 38 - 126 U/L 79     AST 15 - 41 U/L 19     ALT 0 - 44 U/L 16        ASSESSMENT & PLAN Francisco Lewis 53 y.o. male with no significant past medical history who presents for evaluation of an unprovoked right lower extremity DVT.  After review of the labs, review of the records, and discussion with the patient the patients findings are most consistent with an unprovoked VTE.  A provoked venous thromboembolism (VTE) is one that has a clear inciting factor or event. Provoking factors include prolonged travel/immobility, surgery (particular abdominal or orthropedic), trauma,  and pregnancy/ estrogen containing birth control. After a detailed history and review of the records there is no clear provoking factor for this patient's VTE.  Patients with unprovoked VTEs have up to 25% recurrence after 5 years and 36% at 10 years, with 4% of these clots being fatal (BMJ?2019;366:l4363). Therefore the formal recommendation for unprovoked VTE's is lifelong anticoagulation, as the cause may not be transient or reversible. We recommend 6 months or full strength anticoagulation with a re-evaluation after that time.  The patient's will then have a choice of maintenance dose DOAC (preferred, recommended), 81mg  ASA PO daily (non-preferred), or no further anticoagulation (not recommended).    #Unprovoked DVT Embolism  # Recurrent DVT  --findings at this time are  consistent with a unprovoked VTE  --will order baseline CMP and CBC to assure labs are adequate for DOAC therapy  --rule out APS with anticardiolipin and anti beta2 glycoprotein antibodies.  Lupus anticoagulant panel would be altered by presence of blood thinner, will hold on this testing.   --recommend the patient continue eliquis  5mg  BID PO  --patient denies any bleeding, bruising, or dark stools on this medication. It is well tolerated. No difficulties accessing/affording the medication  --RTC in 6 months' time with strict return precautions for overt signs of bleeding.     Orders Placed This Encounter  Procedures   CBC with Differential (Cancer Center Only)    Standing Status:   Future    Number of Occurrences:   1    Expiration Date:   10/20/2024   CMP (Cancer Center only)    Standing Status:   Future    Number of Occurrences:   1    Expiration Date:   10/20/2024   Cardiolipin antibodies, IgG, IgM, IgA*    Standing Status:   Future    Number of Occurrences:   1    Expiration Date:   10/20/2024   Beta-2 -glycoprotein i abs, IgG/M/A    Standing Status:   Future    Number of Occurrences:   1    Expiration Date:   10/20/2024    All questions were answered. The patient knows to call the clinic with any problems, questions or concerns.  A total of more than 60 minutes were spent on this encounter with face-to-face time and non-face-to-face time, including preparing to see the patient, ordering tests and/or medications, counseling  the patient and coordination of care as outlined above.   Rogerio Clay, MD Department of Hematology/Oncology [facility] Phone: [phone] Pager: [phone] Email: [email or web address]  10/27/2023 12:47 PM

## 2023-10-22 LAB — CARDIOLIPIN ANTIBODIES, IGG, IGM, IGA
Anticardiolipin IgA: <9 U/mL (ref 0–11)
Anticardiolipin IgG: <9 GPL U/mL (ref 0–14)
Anticardiolipin IgM: <9 [MPL'U]/mL (ref 0–12)

## 2023-10-23 LAB — BETA-2-GLYCOPROTEIN I ABS, IGG/M/A
Beta-2 Glyco I IgG: <9 GPI IgG units (ref 0–20)
Beta-2-Glycoprotein I IgA: <9 GPI IgA units (ref 0–25)
Beta-2-Glycoprotein I IgM: <9 GPI IgM units (ref 0–32)

## 2024-03-22 ENCOUNTER — Other Ambulatory Visit: Payer: Self-pay | Admitting: Hematology and Oncology

## 2024-03-22 DIAGNOSIS — I82451 Acute embolism and thrombosis of right peroneal vein: Secondary | ICD-10-CM

## 2024-03-22 NOTE — Progress Notes (Deleted)
 [facility] Telephone:([phone]) [phone]   Fax:([phone]) [phone]  PROGRESS NOTE  Patient Care Team: Elaine Rocky NOVAK, NP-C as PCP - General (Pediatrics)  Hematological/Oncological History # ***  Interval History:  Francisco Lewis 53 y.o. male with medical history significant for *** presents for a follow up visit. The patient's last visit was on ***. In the interim since the last visit ***  MEDICAL HISTORY:  No past medical history on file.  SURGICAL HISTORY: Past Surgical History:  Procedure Laterality Date   ORTHOPEDIC SURGERY     right leg hardware    SOCIAL HISTORY: Social History   Socioeconomic History   Marital status: Single    Spouse name: Not on file   Number of children: Not on file   Years of education: Not on file   Highest education level: Not on file  Occupational History   Not on file  Tobacco Use   Smoking status: Every Day    Current packs/day: 0.50    Types: Cigarettes   Smokeless tobacco: Never  Substance and Sexual Activity   Alcohol use: No   Drug use: No   Sexual activity: Not on file  Other Topics Concern   Not on file  Social History Narrative   Not on file   Social Drivers of Health   Financial Resource Strain: Not on file  Food Insecurity: Not on file  Transportation Needs: Not on file  Physical Activity: Not on file  Stress: Not on file  Social Connections: Not on file  Intimate Partner Violence: Not on file    FAMILY HISTORY: No family history on file.  ALLERGIES:  is allergic to apple juice and shellfish allergy.  MEDICATIONS:  Current Outpatient Medications  Medication Sig Dispense Refill   apixaban  (ELIQUIS ) 5 MG TABS tablet Take 1 tablet (5 mg total) by mouth 2 (two) times daily. 180 tablet 1   ascorbic acid (VITAMIN C) 500 MG tablet Take 500 mg by mouth daily.     Buprenorphine HCl-Naloxone HCl 12-3 MG FILM Place 1 Film under the tongue 2 (two) times daily.     cholecalciferol (VITAMIN D3) 25 MCG (1000  UNIT) tablet Take 1,000 Units by mouth daily.     sildenafil (VIAGRA) 100 MG tablet Take 100 mg by mouth daily as needed.     vitamin B-12 (CYANOCOBALAMIN) 100 MCG tablet Take 100 mcg by mouth daily.     No current facility-administered medications for this visit.    REVIEW OF SYSTEMS:   Constitutional: ( - ) fevers, ( - )  chills , ( - ) night sweats Eyes: ( - ) blurriness of vision, ( - ) double vision, ( - ) watery eyes Ears, nose, mouth, throat, and face: ( - ) mucositis, ( - ) sore throat Respiratory: ( - ) cough, ( - ) dyspnea, ( - ) wheezes Cardiovascular: ( - ) palpitation, ( - ) chest discomfort, ( - ) lower extremity swelling Gastrointestinal:  ( - ) nausea, ( - ) heartburn, ( - ) change in bowel habits Skin: ( - ) abnormal skin rashes Lymphatics: ( - ) new lymphadenopathy, ( - ) easy bruising Neurological: ( - ) numbness, ( - ) tingling, ( - ) new weaknesses Behavioral/Psych: ( - ) mood change, ( - ) new changes  All other systems were reviewed with the patient and are negative.  PHYSICAL EXAMINATION: ECOG PERFORMANCE STATUS: {CHL ONC ECOG PS:[id number]}  There were no vitals filed for this visit. There were  no vitals filed for this visit.  GENERAL: alert, no distress and comfortable SKIN: skin color, texture, turgor are normal, no rashes or significant lesions EYES: conjunctiva are pink and non-injected, sclera clear OROPHARYNX: no exudate, no erythema; lips, buccal mucosa, and tongue normal  NECK: supple, non-tender LYMPH:  no palpable lymphadenopathy in the cervical, axillary or inguinal LUNGS: clear to auscultation and percussion with normal breathing effort HEART: regular rate & rhythm and no murmurs and no lower extremity edema ABDOMEN: soft, non-tender, non-distended, normal bowel sounds Musculoskeletal: no cyanosis of digits and no clubbing  PSYCH: alert & oriented x 3, fluent speech NEURO: no focal motor/sensory deficits  LABORATORY DATA:  I have reviewed  the data as listed    Latest Ref Rng & Units 10/21/2023    2:04 PM 09/24/2023    9:26 PM 09/22/2023    5:48 PM  CBC  WBC 4.0 - 10.5 K/uL 6.5  7.0  7.0   Hemoglobin 13.0 - 17.0 g/dL 85.6  85.8  85.9   Hematocrit 39.0 - 52.0 % 41.3  42.3  42.9   Platelets 150 - 400 K/uL 223  252  238        Latest Ref Rng & Units 10/21/2023    2:04 PM 09/24/2023    9:26 PM 09/22/2023    5:48 PM  CMP  Glucose 70 - 99 mg/dL 896  93  892   BUN 6 - 20 mg/dL 17  13  13    Creatinine 0.61 - 1.24 mg/dL 9.09  9.11  8.96   Sodium 135 - 145 mmol/L 138  140  139   Potassium 3.5 - 5.1 mmol/L 4.4  4.3  4.5   Chloride 98 - 111 mmol/L 103  106  104   CO2 22 - 32 mmol/L 29  25  26    Calcium 8.9 - 10.3 mg/dL 9.4  9.1  9.0   Total Protein 6.5 - 8.1 g/dL 7.5     Total Bilirubin 0.0 - 1.2 mg/dL 0.2     Alkaline Phos 38 - 126 U/L 79     AST 15 - 41 U/L 19     ALT 0 - 44 U/L 16       No results found for: MPROTEIN No results found for: KPAFRELGTCHN, LAMBDASER, KAPLAMBRATIO   BLOOD FILM: *** Review of the peripheral blood smear showed normal appearing white cells with neutrophils that were appropriately lobated and granulated. There was no predominance of bi-lobed or hyper-segmented neutrophils appreciated. No Dohle bodies were noted. There was no left shifting, immature forms or blasts noted. Lymphocytes remain normal in size without any predominance of large granular lymphocytes. Red cells show no anisopoikilocytosis, macrocytes , microcytes or polychromasia. There were no schistocytes, target cells, echinocytes, acanthocytes, dacrocytes, or stomatocytes.There was no rouleaux formation, nucleated red cells, or intra-cellular inclusions noted. The platelets are normal in size, shape, and color without any clumping evident.  RADIOGRAPHIC STUDIES: I have personally reviewed the radiological images as listed and agreed with the findings in the report. No results found.  ASSESSMENT & PLAN ***  No orders of the defined  types were placed in this encounter.   All questions were answered. The patient knows to call the clinic with any problems, questions or concerns.  A total of more than {CHL ONC TIME VISIT - [id number]} were spent on this encounter with face-to-face time and non-face-to-face time, including preparing to see the patient, ordering tests and/or medications, counseling the patient and coordination of care  as outlined above.   Norleen IVAR Kidney, MD Department of Hematology/Oncology [facility] Phone: [phone] Pager: [phone] Email: [email or web address]  03/22/2024 9:37 PM

## 2024-03-23 ENCOUNTER — Inpatient Hospital Stay: Attending: Pediatrics

## 2024-03-23 ENCOUNTER — Inpatient Hospital Stay: Admitting: Hematology and Oncology

## 2024-04-12 ENCOUNTER — Inpatient Hospital Stay: Admitting: Hematology and Oncology

## 2024-04-12 ENCOUNTER — Inpatient Hospital Stay

## 2024-04-12 ENCOUNTER — Telehealth: Payer: Self-pay | Admitting: *Deleted

## 2024-04-12 NOTE — Progress Notes (Signed)
 No show

## 2024-04-12 NOTE — Telephone Encounter (Signed)
 TCT patient to inform of missed appt. No answer - left general voice mail on unnamed VM to contact office. Schedule message sent to reschedule.

## 2024-05-19 ENCOUNTER — Other Ambulatory Visit: Payer: Self-pay

## 2024-05-19 ENCOUNTER — Emergency Department (HOSPITAL_BASED_OUTPATIENT_CLINIC_OR_DEPARTMENT_OTHER)
Admission: EM | Admit: 2024-05-19 | Discharge: 2024-05-19 | Disposition: A | Discharge status: Home / Self Care | Source: Home / Self Care

## 2024-05-19 ENCOUNTER — Emergency Department (HOSPITAL_BASED_OUTPATIENT_CLINIC_OR_DEPARTMENT_OTHER): Admitting: Radiology

## 2024-05-19 ENCOUNTER — Encounter (HOSPITAL_BASED_OUTPATIENT_CLINIC_OR_DEPARTMENT_OTHER): Payer: Self-pay

## 2024-05-19 DIAGNOSIS — M791 Myalgia, unspecified site: Secondary | ICD-10-CM | POA: Insufficient documentation

## 2024-05-19 DIAGNOSIS — Z7901 Long term (current) use of anticoagulants: Secondary | ICD-10-CM | POA: Diagnosis not present

## 2024-05-19 DIAGNOSIS — R509 Fever, unspecified: Secondary | ICD-10-CM | POA: Insufficient documentation

## 2024-05-19 DIAGNOSIS — R6889 Other general symptoms and signs: Secondary | ICD-10-CM

## 2024-05-19 DIAGNOSIS — R059 Cough, unspecified: Secondary | ICD-10-CM | POA: Diagnosis present

## 2024-05-19 MED ORDER — ALBUTEROL SULFATE HFA 108 (90 BASE) MCG/ACT IN AERS
2.0000 | INHALATION_SPRAY | Freq: Once | RESPIRATORY_TRACT | Status: AC
  Administered 2024-05-19: 2 via RESPIRATORY_TRACT
  Filled 2024-05-19: qty 6.7

## 2024-05-19 NOTE — Discharge Instructions (Signed)
 Your chest xray does not show any pneumonia. Your symptoms are consistent with a viral, flu-like illness and will resolve over time. Use the inhaler for cough relief if this helps. You can also continue to use Mucinex. Take Tylenol  for any aches.   It is very important that you keep your scheduled appointment with your doctor and to discuss your decision to stop taking your blood thinner. You have no signs or symptoms of leg clot at this time, but are at extremely high risk based on the history you shared with me. Please consider following your doctors advice regarding this medication.

## 2024-05-19 NOTE — ED Triage Notes (Signed)
 Presents to ED with cold symptoms. Sick since Sunday. Fevers, body aches, decreased appetite, headache.

## 2024-05-19 NOTE — ED Provider Notes (Signed)
 " [facility] AT [facility] Provider Note   CSN: [id number] Arrival date & time: 05/19/24  1614     Patient presents with: Cough   Jaleen Finch is a 54 y.o. male.   Patient to ED for evaluation of symptoms of fever/chills, body aches, cough that came on quickly 3 days ago. He left work and reports today is the first day he has left his bed. He has a productive cough. No vomiting or diarrhea. He reports he lost his appetite but continues to drink well and is urinating per his normal.   The history is provided by the patient. No language interpreter was used.  Cough      Prior to Admission medications  Medication Sig Start Date End Date Taking? Authorizing Provider  apixaban  (ELIQUIS ) 5 MG TABS tablet Take 1 tablet (5 mg total) by mouth 2 (two) times daily. 10/19/23   Barbarann Dixon B, RPH-CPP  ascorbic acid (VITAMIN C) 500 MG tablet Take 500 mg by mouth daily.    [provider]  Buprenorphine HCl-Naloxone HCl 12-3 MG FILM Place 1 Film under the tongue 2 (two) times daily. 09/29/22   [provider]  cholecalciferol (VITAMIN D3) 25 MCG (1000 UNIT) tablet Take 1,000 Units by mouth daily.    [provider]  sildenafil (VIAGRA) 100 MG tablet Take 100 mg by mouth daily as needed. 07/02/23   [provider]  vitamin B-12 (CYANOCOBALAMIN) 100 MCG tablet Take 100 mcg by mouth daily.    [provider]    Allergies: Apple juice and Shellfish allergy    Review of Systems  Respiratory:  Positive for cough.     Updated Vital Signs BP 130/86   Pulse 76   Temp 98.3 F (36.8 C) (Oral)   Resp 20   Ht 5' 11 (1.803 m)   Wt 97.5 kg   SpO2 96%   BMI 29.99 kg/m   Physical Exam Vitals and nursing note reviewed.  Constitutional:      Appearance: He is well-developed.  HENT:     Head: Normocephalic.  Cardiovascular:     Rate and Rhythm: Normal rate and regular rhythm.  Pulmonary:     Effort: Pulmonary effort is  normal.     Breath sounds: Rhonchi and rales present. No wheezing.     Comments: Coarse breath sounds on inspiration and expiration.  Chest:     Chest wall: No tenderness.  Abdominal:     General: Bowel sounds are normal.     Palpations: Abdomen is soft.     Tenderness: There is no abdominal tenderness. There is no guarding or rebound.  Musculoskeletal:        General: Normal range of motion.     Cervical back: Normal range of motion and neck supple.  Skin:    General: Skin is warm and dry.  Neurological:     General: No focal deficit present.     Mental Status: He is alert and oriented to person, place, and time.     (all labs ordered are listed, but only abnormal results are displayed) Labs Reviewed - No data to display  EKG: None  Radiology: DG Chest 2 View Result Date: 05/19/2024 CLINICAL DATA:  Fever and cough. EXAM: DG CHEST 2V COMPARISON:  Chest radiograph dated 09/24/2023. FINDINGS: The heart size and mediastinal contours are within normal limits. Both lungs are clear. The visualized skeletal structures are unremarkable. IMPRESSION: No active cardiopulmonary disease. Electronically Signed   By:  Vanetta Chou M.D.   On: 05/19/2024 17:38     Procedures   Medications Ordered in the ED  albuterol  (VENTOLIN  HFA) 108 (90 Base) MCG/ACT inhaler 2 puff (has no administration in time range)    Clinical Course as of 05/19/24 1749  Thu May 19, 2024  1747 Patient to ED with flu-like symptoms x 4 days. Well appearing. VSS, no hypoxia. No sOB, no chest pain. Very coarse lung sounds. CXR clear. Inhaler provided for cough symptoms prn.   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 No h/o PE. No ss/sxs of acute dVT or PE today. Discussed the importance of discussing his decision to stop Eliquis  with his doctor on his upcoming scheduled visit.   He is stable for discharge home.  [SU]    Clinical Course User Index [SU] Odell Balls, PA-C                                 Medical Decision Making Amount and/or Complexity of Data Reviewed Radiology: ordered.        Final diagnoses:  Flu-like symptoms    ED Discharge Orders     None          Odell Balls, PA-C 05/19/24 1750    Kammerer, Megan L, DO 05/22/24 1400  "

## 2024-05-30 ENCOUNTER — Inpatient Hospital Stay: Attending: Pediatrics

## 2024-05-30 ENCOUNTER — Inpatient Hospital Stay: Admitting: Hematology and Oncology

## 2024-05-30 NOTE — Progress Notes (Signed)
 No show
